# Patient Record
Sex: Female | Born: 1965 | Race: Black or African American | Hispanic: No | Marital: Married | Smoking: Former smoker
Health system: Southern US, Community
[De-identification: ages and names within clinical notes are randomized; demographics above are authoritative.]

## PROBLEM LIST (undated history)

## (undated) DIAGNOSIS — E559 Vitamin D deficiency, unspecified: Secondary | ICD-10-CM

## (undated) DIAGNOSIS — E785 Hyperlipidemia, unspecified: Secondary | ICD-10-CM

## (undated) HISTORY — PX: LAPAROSCOPIC CHOLECYSTECTOMY: SUR755

## (undated) HISTORY — DX: Hyperlipidemia, unspecified: E78.5

## (undated) HISTORY — DX: Morbid (severe) obesity due to excess calories: E66.01

## (undated) HISTORY — DX: Vitamin D deficiency, unspecified: E55.9

---

## 1991-08-15 HISTORY — PX: BREAST SURGERY: SHX581

## 2000-06-01 ENCOUNTER — Other Ambulatory Visit: Admission: RE | Admit: 2000-06-01 | Discharge: 2000-06-01 | Payer: Self-pay | Admitting: Internal Medicine

## 2001-04-07 ENCOUNTER — Emergency Department (HOSPITAL_COMMUNITY): Admission: EM | Admit: 2001-04-07 | Discharge: 2001-04-07 | Payer: Self-pay | Admitting: Emergency Medicine

## 2001-08-14 HISTORY — PX: TUBAL LIGATION: SHX77

## 2002-03-20 ENCOUNTER — Emergency Department (HOSPITAL_COMMUNITY): Admission: EM | Admit: 2002-03-20 | Discharge: 2002-03-20 | Payer: Self-pay | Admitting: Emergency Medicine

## 2002-03-20 ENCOUNTER — Encounter: Payer: Self-pay | Admitting: Emergency Medicine

## 2004-06-01 ENCOUNTER — Other Ambulatory Visit: Admission: RE | Admit: 2004-06-01 | Discharge: 2004-06-01 | Payer: Self-pay | Admitting: Emergency Medicine

## 2005-08-25 ENCOUNTER — Encounter: Admission: RE | Admit: 2005-08-25 | Discharge: 2005-08-25 | Payer: Self-pay | Admitting: Emergency Medicine

## 2005-10-16 ENCOUNTER — Other Ambulatory Visit: Admission: RE | Admit: 2005-10-16 | Discharge: 2005-10-16 | Payer: Self-pay | Admitting: Family Medicine

## 2006-08-14 HISTORY — PX: FOOT SURGERY: SHX648

## 2006-11-13 ENCOUNTER — Emergency Department (HOSPITAL_COMMUNITY): Admission: EM | Admit: 2006-11-13 | Discharge: 2006-11-14 | Payer: Self-pay | Admitting: Emergency Medicine

## 2007-09-11 ENCOUNTER — Other Ambulatory Visit: Admission: RE | Admit: 2007-09-11 | Discharge: 2007-09-11 | Payer: Self-pay | Admitting: Emergency Medicine

## 2007-09-18 DIAGNOSIS — A5901 Trichomonal vulvovaginitis: Secondary | ICD-10-CM

## 2007-10-07 ENCOUNTER — Ambulatory Visit (HOSPITAL_COMMUNITY): Admission: RE | Admit: 2007-10-07 | Discharge: 2007-10-07 | Payer: Self-pay | Admitting: Emergency Medicine

## 2008-08-16 ENCOUNTER — Emergency Department (HOSPITAL_COMMUNITY): Admission: EM | Admit: 2008-08-16 | Discharge: 2008-08-16 | Payer: Self-pay | Admitting: Emergency Medicine

## 2009-01-05 DIAGNOSIS — D259 Leiomyoma of uterus, unspecified: Secondary | ICD-10-CM | POA: Insufficient documentation

## 2009-12-27 ENCOUNTER — Ambulatory Visit (HOSPITAL_COMMUNITY): Admission: RE | Admit: 2009-12-27 | Discharge: 2009-12-28 | Payer: Self-pay | Admitting: Otolaryngology

## 2009-12-27 ENCOUNTER — Encounter (INDEPENDENT_AMBULATORY_CARE_PROVIDER_SITE_OTHER): Payer: Self-pay | Admitting: Otolaryngology

## 2010-03-04 ENCOUNTER — Ambulatory Visit (HOSPITAL_BASED_OUTPATIENT_CLINIC_OR_DEPARTMENT_OTHER): Admission: RE | Admit: 2010-03-04 | Discharge: 2010-03-04 | Payer: Self-pay | Admitting: Specialist

## 2010-05-26 ENCOUNTER — Emergency Department (HOSPITAL_COMMUNITY): Admission: EM | Admit: 2010-05-26 | Discharge: 2010-05-26 | Payer: Self-pay | Admitting: Emergency Medicine

## 2010-07-20 ENCOUNTER — Ambulatory Visit: Payer: Self-pay | Admitting: Obstetrics & Gynecology

## 2010-07-21 ENCOUNTER — Emergency Department (HOSPITAL_COMMUNITY): Admission: EM | Admit: 2010-07-21 | Discharge: 2010-06-27 | Payer: Self-pay | Admitting: Emergency Medicine

## 2010-08-02 ENCOUNTER — Ambulatory Visit: Payer: Self-pay | Admitting: Internal Medicine

## 2010-08-02 DIAGNOSIS — E7800 Pure hypercholesterolemia, unspecified: Secondary | ICD-10-CM | POA: Insufficient documentation

## 2010-08-02 DIAGNOSIS — N920 Excessive and frequent menstruation with regular cycle: Secondary | ICD-10-CM | POA: Insufficient documentation

## 2010-08-02 DIAGNOSIS — R079 Chest pain, unspecified: Secondary | ICD-10-CM

## 2010-08-03 ENCOUNTER — Encounter (INDEPENDENT_AMBULATORY_CARE_PROVIDER_SITE_OTHER): Payer: Self-pay | Admitting: Internal Medicine

## 2010-08-04 ENCOUNTER — Ambulatory Visit (HOSPITAL_COMMUNITY)
Admission: RE | Admit: 2010-08-04 | Discharge: 2010-08-04 | Payer: Self-pay | Source: Home / Self Care | Attending: Family Medicine | Admitting: Family Medicine

## 2010-08-11 ENCOUNTER — Telehealth (INDEPENDENT_AMBULATORY_CARE_PROVIDER_SITE_OTHER): Payer: Self-pay | Admitting: Internal Medicine

## 2010-08-14 HISTORY — PX: KNEE SURGERY: SHX244

## 2010-08-16 ENCOUNTER — Encounter (INDEPENDENT_AMBULATORY_CARE_PROVIDER_SITE_OTHER): Payer: Self-pay | Admitting: Internal Medicine

## 2010-08-16 ENCOUNTER — Telehealth (INDEPENDENT_AMBULATORY_CARE_PROVIDER_SITE_OTHER): Payer: Self-pay | Admitting: Internal Medicine

## 2010-09-01 ENCOUNTER — Encounter: Payer: Self-pay | Admitting: Family

## 2010-09-01 ENCOUNTER — Ambulatory Visit
Admission: RE | Admit: 2010-09-01 | Discharge: 2010-09-01 | Payer: Self-pay | Source: Home / Self Care | Attending: Obstetrics and Gynecology | Admitting: Obstetrics and Gynecology

## 2010-09-01 ENCOUNTER — Other Ambulatory Visit: Payer: Self-pay | Admitting: Family

## 2010-09-01 ENCOUNTER — Other Ambulatory Visit
Admission: RE | Admit: 2010-09-01 | Discharge: 2010-09-01 | Payer: Self-pay | Source: Home / Self Care | Admitting: Obstetrics and Gynecology

## 2010-09-01 LAB — CONVERTED CEMR LAB
Hemoglobin: 10.6 g/dL — ABNORMAL LOW (ref 12.0–15.0)
MCHC: 31.5 g/dL (ref 30.0–36.0)
MCV: 85.1 fL (ref 78.0–100.0)
Platelets: 431 10*3/uL — ABNORMAL HIGH (ref 150–400)
RBC: 3.96 M/uL (ref 3.87–5.11)
TSH: 1.913 microintl units/mL (ref 0.350–4.500)

## 2010-09-04 ENCOUNTER — Encounter: Payer: Self-pay | Admitting: Emergency Medicine

## 2010-09-05 LAB — POCT PREGNANCY, URINE: Preg Test, Ur: NEGATIVE

## 2010-09-12 ENCOUNTER — Ambulatory Visit (HOSPITAL_COMMUNITY)
Admission: RE | Admit: 2010-09-12 | Discharge: 2010-09-12 | Payer: Self-pay | Source: Home / Self Care | Attending: Obstetrics & Gynecology | Admitting: Obstetrics & Gynecology

## 2010-09-15 ENCOUNTER — Ambulatory Visit: Admit: 2010-09-15 | Payer: Self-pay | Admitting: Obstetrics and Gynecology

## 2010-09-15 ENCOUNTER — Ambulatory Visit: Payer: Self-pay | Admitting: Physician Assistant

## 2010-09-15 DIAGNOSIS — N949 Unspecified condition associated with female genital organs and menstrual cycle: Secondary | ICD-10-CM

## 2010-09-15 DIAGNOSIS — N938 Other specified abnormal uterine and vaginal bleeding: Secondary | ICD-10-CM

## 2010-09-15 NOTE — Letter (Signed)
Summary: REQUESTING RECORDS FROM GYN/COULD NOT LOCATE RECORDS  REQUESTING RECORDS FROM GYN/COULD NOT LOCATE RECORDS   Imported By: Arta Bruce 08/09/2010 11:51:29  _____________________________________________________________________  External Attachment:    Type:   Image     Comment:   External Document

## 2010-09-15 NOTE — Letter (Signed)
Summary: REQUESTING RECORDS FROM [facility]  REQUESTING RECORDS FROM [facility]   Imported By: Arta Bruce 08/09/2010 14:02:15  _____________________________________________________________________  External Attachment:    Type:   Image     Comment:   External Document

## 2010-09-15 NOTE — Progress Notes (Signed)
Summary: Still having menstrual bleeding  Phone Note Call from Patient   Summary of Call: PT WAS SEEN ON THE 20TH OF DEC/ DR Athira Janowicz TOLD HER TO CALL IF HER BLEEDING DID NOT STOP IT HAS LIGHTEN ALOT BUT NOT COMP[LETLEY STOPPED [phone] Initial call taken by: Arta Bruce,  August 11, 2010 4:43 PM  Follow-up for Phone Call        Is going through 3 pads per day, has progressively gotten lighter, darker red, but not completely stopped.  Is having abdominal cramping.  Is not taking anything for it.  Advised that she can take ibuprofen OTC for the cramping. Follow-up by: Dutch Quint RN,  August 11, 2010 5:23 PM  Additional Follow-up for Phone Call Additional follow up Details #1::        Let her know we are going to refer her back to gyn.  Also, let's try Medroxyprogesterone 20 mg daily for 7 days--will send to [facility] [address] unless she request elsewhere. Additional Follow-up by: Julieanne Manson MD,  August 16, 2010 8:58 AM    Additional Follow-up for Phone Call Additional follow up Details #2::    Left message on answering machine for pt. to return call.  Dutch Quint RN  August 17, 2010 12:54 PM  Pt. states that bleeding is heavier than ever -- is now having cramping and has gone through [address] pads since Sunday.  Is also passing a lot of clots.  Should she still get the medication or do something else?  Theresa Laib RN  August 18, 2010 9:38 AM  Yes, she should still take the medication as instructed above by Dr. Anayely Constantine n.martin,fnp August 18, 2010 7:57 PM  Pt. made aware of gyn referral and to take new Rx - verbalized understanding and agreement.  Theresa Laib RN  August 19, 2010 11:54 AM   New/Updated Medications: MEDROXYPROGESTERONE ACETATE 10 MG TABS (MEDROXYPROGESTERONE ACETATE) 2 tabs by mouth daily for 7 days Prescriptions: MEDROXYPROGESTERONE ACETATE 10 MG TABS (MEDROXYPROGESTERONE ACETATE) 2 tabs by mouth daily for 7 days  #14 x 0   Entered and  Authorized by:   Denver Harder MD   Signed by:   Gerlean Cid MD on 08/16/2010   Method used:   Electronically to        [facility] [address] [id number] (retail)       [address]       [place], [place]  [address]       Ph: [phone]       Fax: [phone]   RxID:   [id number]

## 2010-09-15 NOTE — Progress Notes (Signed)
Summary: GYN REFERRAL   Phone Note Outgoing Call   Summary of Call: [name]--Gyne referral Initial call taken by: Julieanne Manson MD,  August 16, 2010 9:01 AM  Follow-up for Phone Call        SEND THE REFERRAL TO [facility] NUTRITION WAITING FOR AN APPT  Follow-up by: Cheryll Dessert,  August 18, 2010 6:48 PM

## 2010-09-15 NOTE — Letter (Signed)
Summary: *Referral Letter  [facility]  [address] [place], [place] [address]   Phone: [phone]  Fax: [phone]    08/16/2010  Thank you in advance for agreeing to see my patient:  [name] [address] [place], [place]  [address]  Phone: [phone]  Reason for Referral: Menometrorrhagia in a new patient.  Pt. gives a history of work up last year.  Reportedly with normal pelvic ultrasound.  Bleeding has slowed, but not stopped with 10 days of 10 mg Medroxyprogesterone.  I have increased dose to 20 mg for a week.  She gives history of being encouraged to consider endometrial ablation following work up last year.  Have sent for her records.  Procedures Requested: Evaluation and recommendations for excessive and prolonged bleeding.  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) 2)  OBESITY (ICD-278.00) 3)  HYPERCHOLESTEROLEMIA (ICD-272.0) 4)  CHEST PAIN (ICD-786.50)   Current Medications: 1)  VITAMIN D 1000 UNIT CAPS (CHOLECALCIFEROL) Take 50,000 International Units once weekly 2)  FEOSOL 200 (65 FE) MG TABS (FERROUS SULFATE DRIED) Take one tablet by mouth daily 3)  HYDROCODONE-ACETAMINOPHEN 5-325 MG TABS (HYDROCODONE-ACETAMINOPHEN) Take one tablet by mouth as needed for knee pain 4)  MEDROXYPROGESTERONE ACETATE 10 MG TABS (MEDROXYPROGESTERONE ACETATE) 2 tabs by mouth daily for 7 days   Past Medical History:  Prior History of Blood Transfusions:   Pertinent Labs:    Thank you again for agreeing to see our patient; please contact us if you have any further questions or need additional information.  Sincerely,  Julieanne Manson MD

## 2010-09-15 NOTE — Assessment & Plan Note (Signed)
Summary: np.. coughing   Vital Signs:  Patient profile:   45 year old female Menstrual status:  irregular LMP:     09/11 Height:      64.5 inches Weight:      276.9 pounds BMI:     46.96 Temp:     97.7 degrees F oral Pulse rate:   80 / minute Pulse rhythm:   regular Resp:     20 per minute BP sitting:   110 / 70  (left arm) Cuff size:   large  Vitals Entered By: Gaylyn Cheers RN (August 02, 2010 12:04 PM) CC: vag bleeding x's 3 mos. with numerous clots goes through a pkg of 32 wkly. prior to that irregular. No pap in 2 yrs. Seen @ [facility] for [facility] last year and again 11/11. Is Patient Diabetic? No Pain Assessment Patient in pain? no       Does patient need assistance? Functional Status Self care Ambulation Normal LMP (date): 09/11     Menstrual Status irregular Enter LMP: 09/11   CC:  vag bleeding x's 3 mos. with numerous clots goes through a pkg of 32 wkly. prior to that irregular. No pap in 2 yrs. Seen @  for CP last year and again 11/11.Marland Kitchen  History of Present Illness: 1.  Vaginal bleeding:  Has always had problems with cycle.  Menarche age 70.  Initially, skipped months.  Eventually, developed problems where bleeds all the time.  Was seen in ED last month for chest pain of unknown etiology and hgb was 11.7. Was on BCPs in years past with control of periods.   Last 3 months, in particular, has been bleeding without stop.  Now getting heavier with more blood clotting.  Using maxi pads.  Using 32 pads in 7 days.  Not much in way of cramping.  G2P2.  Delivered at term.  Youngest son is 70.  Did not really have much of change after pregnancies.  Was seen by a gynecologist in 2010, who recommended endometrial ablation.  Did undergo pelvic ultrasound that was reportedly fine.    Current Medications (verified): 1)  Vitamin D 1000 Unit Caps (Cholecalciferol) .... Take 50,000 International Units Once Weekly 2)  Feosol 200 (65 Fe) Mg Tabs (Ferrous Sulfate Dried) .... Take  One Tablet By Mouth Daily 3)  Hydrocodone-Acetaminophen 5-325 Mg Tabs (Hydrocodone-Acetaminophen) .... Take One Tablet By Mouth As Needed For Knee Pain  Allergies (verified): No Known Drug Allergies  Past History:  Past Surgical History: 1. 2009:   Right foot surgery   2.  2011:  Left arthroscopic surgery:  Dr. Blossom Hoops debridement. 3.  2005:  Laparoscopic cholecystectomy 4.  1992:  BTL  Family History: Mother, died 33:  Uterine cancer and PE. Father, died 28:  DM, not sure of cause of death. 8 siblings:  1 sister, died 46:  rare lung disease. Son, 25:  Healthy Son, 19:  Healthy  Social History: Married--but informal Metallurgist. Lives at home with 2 sons.   Tobacco Use:  no Alcohol Use: no Drugh Use:  no  Physical Exam  General:  morbidly obese, NAD Lungs:  Normal respiratory effort, chest expands symmetrically. Lungs are clear to auscultation, no crackles or wheezes. Heart:  Normal rate and regular rhythm. S1 and S2 normal without gallop, murmur, click, rub or other extra sounds. Abdomen:  Bowel sounds positive,abdomen soft and non-tender without masses, organomegaly or hernias noted.   Impression & Recommendations:  Problem # 1:  EXCESSIVE OR  FREQUENT MENSTRUATION (ICD-626.2) Need records. Her updated medication list for this problem includes:    Medroxyprogesterone Acetate 10 Mg Tabs (Medroxyprogesterone acetate) .Marland Kitchen... 1 tab by mouth daily for 10 days  Complete Medication List: 1)  Vitamin D 1000 Unit Caps (Cholecalciferol) .... Take 50,000 international units once weekly 2)  Feosol 200 (65 Fe) Mg Tabs (Ferrous sulfate dried) .... Take one tablet by mouth daily 3)  Hydrocodone-acetaminophen 5-325 Mg Tabs (Hydrocodone-acetaminophen) .... Take one tablet by mouth as needed for knee pain 4)  Medroxyprogesterone Acetate 10 Mg Tabs (Medroxyprogesterone acetate) .Marland Kitchen.. 1 tab by mouth daily for 10 days  Other Orders: Flu Vaccine 59yrs + (96295) Admin 1st  Vaccine (28413)  Patient Instructions: 1)  Release of information from [facility] and Dr. Cheri Guppy at [facility] records. 2)  Schedule CPP next available with Dr. Delrae Alfred 3)  Call update next Tuesday if still bleeding. Prescriptions: MEDROXYPROGESTERONE ACETATE 10 MG TABS (MEDROXYPROGESTERONE ACETATE) 1 tab by mouth daily for 10 days  #10 x 0   Entered and Authorized by:   Julieanne Manson MD   Signed by:   Julieanne Manson MD on 08/02/2010   Method used:   Print then Give to Patient   RxID:   [id number]    Orders Added: 1)  Flu Vaccine 28yrs + [34742] 2)  Admin 1st Vaccine [90471] 3)  New Patient Level II [59563]   Immunizations Administered:  Influenza Vaccine # 1:    Vaccine Type: Fluvax 3+    Site: left deltoid    Mfr: GlaxoSmithKline    Dose: 0.5 ml    Route: IM    Given by: Gaylyn Cheers RN    Exp. Date: 02/11/2011    Lot #: [id number]    VIS given: 03/08/10 version given August 02, 2010.  Flu Vaccine Consent Questions:    Do you have a history of severe allergic reactions to this vaccine? no    Any prior history of allergic reactions to egg and/or gelatin? no    Do you have a sensitivity to the preservative Thimersol? no    Do you have a past history of Guillan-Barre Syndrome? no    Do you currently have an acute febrile illness? no    Have you ever had a severe reaction to latex? no    Vaccine information given and explained to patient? yes    Are you currently pregnant? no   Immunizations Administered:  Influenza Vaccine # 1:    Vaccine Type: Fluvax 3+    Site: left deltoid    Mfr: GlaxoSmithKline    Dose: 0.5 ml    Route: IM    Given by: Gaylyn Cheers RN    Exp. Date: 02/11/2011    Lot #: [id number]    VIS given: 03/08/10 version given August 02, 2010.

## 2010-10-09 ENCOUNTER — Encounter (INDEPENDENT_AMBULATORY_CARE_PROVIDER_SITE_OTHER): Payer: Self-pay | Admitting: Internal Medicine

## 2010-10-09 DIAGNOSIS — M2240 Chondromalacia patellae, unspecified knee: Secondary | ICD-10-CM | POA: Insufficient documentation

## 2010-10-09 DIAGNOSIS — M1710 Unilateral primary osteoarthritis, unspecified knee: Secondary | ICD-10-CM | POA: Insufficient documentation

## 2010-10-09 DIAGNOSIS — Z862 Personal history of diseases of the blood and blood-forming organs and certain disorders involving the immune mechanism: Secondary | ICD-10-CM | POA: Insufficient documentation

## 2010-10-20 NOTE — Miscellaneous (Signed)
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 2009:   Right foot surgery   2.  2011:  Left arthroscopic surgery:  Dr. Blossom Hoops debridement. 3.  2005:  Laparoscopic cholecystectomy 4.  1992:  BTL 5.  12/2008: Endometrial biopsy  Dr. Burr Medico (10/09/2010 14:12)        Past History:  Past Surgical History: 1. 2009:   Right foot surgery   2.  2011:  Left arthroscopic surgery:  Dr. Blossom Hoops debridement. 3.  2005:  Laparoscopic cholecystectomy 4.  1992:  BTL 5.  12/2008: Endometrial biopsy  Dr. Burr Medico

## 2010-10-20 NOTE — Letter (Signed)
Summary: RECEIVED RECORDS FROM [facility]  RECEIVED RECORDS FROM [facility]   Imported By: Arta Bruce 10/11/2010 16:12:49  _____________________________________________________________________  External Attachment:    Type:   Image     Comment:   External Document

## 2010-10-25 LAB — BASIC METABOLIC PANEL
CO2: 28 mEq/L (ref 19–32)
Chloride: 103 mEq/L (ref 96–112)
Creatinine, Ser: 0.93 mg/dL (ref 0.4–1.2)
GFR calc Af Amer: >60 mL/min (ref >60)
Glucose, Bld: 138 mg/dL — ABNORMAL HIGH (ref 70–99)
Sodium: 137 mEq/L (ref 135–145)

## 2010-10-25 LAB — CBC
Hemoglobin: 11.7 g/dL — ABNORMAL LOW (ref 12.0–15.0)
MCH: 26.5 pg (ref 26.0–34.0)
MCHC: 31.5 g/dL (ref 30.0–36.0)
MCV: 84.2 fL (ref 78.0–100.0)
RBC: 4.42 MIL/uL (ref 3.87–5.11)

## 2010-10-25 LAB — DIFFERENTIAL
Basophils Relative: 0 % (ref 0–1)
Eosinophils Absolute: 0.2 10*3/uL (ref 0.0–0.7)
Eosinophils Relative: 2 % (ref 0–5)
Lymphs Abs: 2.7 10*3/uL (ref 0.7–4.0)
Monocytes Relative: 8 % (ref 3–12)

## 2010-10-25 LAB — POCT CARDIAC MARKERS
CKMB, poc: <1 ng/mL — ABNORMAL LOW (ref 1.0–8.0)
Myoglobin, poc: 59.8 ng/mL (ref 12–200)
Troponin i, poc: <0.05 ng/mL (ref 0.00–0.09)
Troponin i, poc: <0.05 ng/mL (ref 0.00–0.09)

## 2010-10-29 LAB — POCT HEMOGLOBIN-HEMACUE: Hemoglobin: 12.5 g/dL (ref 12.0–15.0)

## 2010-11-01 LAB — BASIC METABOLIC PANEL
BUN: 7 mg/dL (ref 6–23)
Calcium: 9.3 mg/dL (ref 8.4–10.5)
Creatinine, Ser: 0.86 mg/dL (ref 0.4–1.2)
GFR calc non Af Amer: >60 mL/min (ref >60)
Glucose, Bld: 91 mg/dL (ref 70–99)

## 2010-11-01 LAB — CBC
HCT: 36.3 % (ref 36.0–46.0)
Platelets: 316 10*3/uL (ref 150–400)
RDW: 14.9 % (ref 11.5–15.5)

## 2010-11-03 NOTE — Progress Notes (Signed)
[name], [name]             ACCOUNT NO.:  [id number]  MEDICAL RECORD NO.:  [id number]           PATIENT TYPE:  LOCATION:  [facility]                     FACILITY:  PHYSICIAN:  Sid Falcon, CNM  DATE OF BIRTH:  [birth year]  DATE OF SERVICE:  09/01/2010                                 CLINIC NOTE  LOCATION:  [facility].  REASON FOR VISIT:  Dysfunctional uterine bleeding.  The patient is here with reports of abnormal bleeding since October 2011, was utilizing approximately 5 pads per day, and passing clots on a daily basis.  The patient was seen at her previous family practice office in which she was prescribed Provera.  She began with 10 mg per day and was increased to 20 mg and the bleeding has not had decreased.  HEALTH CARE MAINTENANCE:  Last mammogram December 2011, normal.  Last dental exam 2011, normal.  MENSTRUAL HISTORY:  Menarche 45 years of age.  Reports irregular cycles x2 years with the first being every 4 to 6 months until this incident of bleeding that started in October.  OBSTETRICAL HISTORY:  History of 2 pregnancies and 2 living children.  GYNECOLOGIC HISTORY:  Last Pap smear 2 years ago.  The patient denies abnormal Pap smear in the past.  STD HISTORY:  Denies.  PAST MEDICAL HISTORY:  Pneumonia.  PAST SURGICAL HISTORY:  Foot and knee surgery, breast reduction, tubal ligation, and gallstones.  SOCIAL HISTORY:  The patient denies smoking, alcohol, or illicit drug use; has had only one partner in one past year.  FAMILY HISTORY:  Father diabetes and mother cancer, type not listed.  REVIEW OF SYSTEMS:  The patient reports continue with lower pelvic pain with the bleeding.  No other symptoms identified.  PHYSICAL EXAMINATION:  VITAL SIGNS:  Stable.  Blood pressure 115/73, pulse 87, temperature 97.9, weight 273 pounds. GENERAL:  Alert and oriented x3.  No signs of acute distress. NECK:  No thyromegaly.  No dominant mass, nontender with  palpation. CHEST:  Cardiovascular system regular rate and rhythm without murmurs, gallops, rubs. LUNGS:  Clear to auscultation. ABDOMEN:  Soft, nontender, no dominant masses palpated. PELVIC:  No abnormal lesions.  No abnormal discharge.  Cervix visualized without difficulty.  Uterus was sounded to approximately 7 cm, endometrial biopsy obtained without difficulty.  Pap smear also obtained prior to the biopsy, slight bleeding with the procedure.  The patient tolerated the procedure well.  Her adnexa and uterus difficult to palpate secondary to weight.  ASSESSMENT:  Dysfunctional uterine bleeding.  PLAN:  Labs:  CBC, TSH, FSH, LH, Pap smear, endometrial biopsy, pelvic ultrasound scheduled.  A prescription given for Megace 60 mg b.i.d. x3 days, then to go to 40 mg daily x10 days.  Consulted with Dr. Macon Large and agreed with plan.  The patient will follow up in 2 weeks.     Sid Falcon, CNM    WM/MEDQ  D:  09/01/2010  T:  09/02/2010  Job:  [id number]

## 2010-11-28 LAB — RAPID STREP SCREEN (MED CTR MEBANE ONLY): Streptococcus, Group A Screen (Direct): NEGATIVE

## 2010-12-02 ENCOUNTER — Ambulatory Visit (INDEPENDENT_AMBULATORY_CARE_PROVIDER_SITE_OTHER): Payer: Self-pay

## 2010-12-02 DIAGNOSIS — N949 Unspecified condition associated with female genital organs and menstrual cycle: Secondary | ICD-10-CM

## 2011-02-10 DIAGNOSIS — N938 Other specified abnormal uterine and vaginal bleeding: Secondary | ICD-10-CM | POA: Insufficient documentation

## 2011-02-20 ENCOUNTER — Ambulatory Visit (INDEPENDENT_AMBULATORY_CARE_PROVIDER_SITE_OTHER): Payer: Self-pay | Admitting: *Deleted

## 2011-02-20 VITALS — BP 118/82 | HR 73

## 2011-02-20 DIAGNOSIS — Z3049 Encounter for surveillance of other contraceptives: Secondary | ICD-10-CM

## 2011-02-20 DIAGNOSIS — N938 Other specified abnormal uterine and vaginal bleeding: Secondary | ICD-10-CM

## 2011-02-20 DIAGNOSIS — N949 Unspecified condition associated with female genital organs and menstrual cycle: Secondary | ICD-10-CM

## 2011-02-21 MED ORDER — MEDROXYPROGESTERONE ACETATE 150 MG/ML IM SUSP
150.0000 mg | INTRAMUSCULAR | Status: AC
  Administered 2011-05-09 – 2011-07-25 (×2): 150 mg via INTRAMUSCULAR

## 2011-02-21 MED ORDER — MEDROXYPROGESTERONE ACETATE 150 MG/ML IM SUSP
150.0000 mg | Freq: Once | INTRAMUSCULAR | Status: AC
  Administered 2011-02-20: 150 mg via INTRAMUSCULAR

## 2011-05-09 ENCOUNTER — Ambulatory Visit (INDEPENDENT_AMBULATORY_CARE_PROVIDER_SITE_OTHER): Payer: Self-pay | Admitting: *Deleted

## 2011-05-09 VITALS — BP 111/77 | HR 75

## 2011-05-09 DIAGNOSIS — N949 Unspecified condition associated with female genital organs and menstrual cycle: Secondary | ICD-10-CM

## 2011-05-09 DIAGNOSIS — Z3049 Encounter for surveillance of other contraceptives: Secondary | ICD-10-CM

## 2011-07-25 ENCOUNTER — Other Ambulatory Visit: Payer: Self-pay | Admitting: Obstetrics & Gynecology

## 2011-07-25 ENCOUNTER — Ambulatory Visit (INDEPENDENT_AMBULATORY_CARE_PROVIDER_SITE_OTHER): Payer: Self-pay

## 2011-07-25 ENCOUNTER — Other Ambulatory Visit: Payer: Self-pay | Admitting: Obstetrics and Gynecology

## 2011-07-25 ENCOUNTER — Other Ambulatory Visit: Payer: Self-pay | Admitting: Family Medicine

## 2011-07-25 VITALS — BP 115/80 | HR 76

## 2011-07-25 DIAGNOSIS — Z1231 Encounter for screening mammogram for malignant neoplasm of breast: Secondary | ICD-10-CM

## 2011-07-25 DIAGNOSIS — N920 Excessive and frequent menstruation with regular cycle: Secondary | ICD-10-CM

## 2011-07-25 DIAGNOSIS — N949 Unspecified condition associated with female genital organs and menstrual cycle: Secondary | ICD-10-CM

## 2011-07-28 ENCOUNTER — Ambulatory Visit (HOSPITAL_COMMUNITY)
Admission: RE | Admit: 2011-07-28 | Discharge: 2011-07-28 | Disposition: A | Discharge status: Home / Self Care | Payer: Self-pay | Source: Ambulatory Visit | Attending: Obstetrics & Gynecology | Admitting: Obstetrics & Gynecology

## 2011-07-28 DIAGNOSIS — Z1231 Encounter for screening mammogram for malignant neoplasm of breast: Secondary | ICD-10-CM | POA: Insufficient documentation

## 2011-08-21 ENCOUNTER — Ambulatory Visit: Payer: Self-pay | Admitting: Obstetrics and Gynecology

## 2011-10-10 ENCOUNTER — Ambulatory Visit: Payer: Self-pay

## 2011-12-15 ENCOUNTER — Other Ambulatory Visit (HOSPITAL_COMMUNITY)
Admission: RE | Admit: 2011-12-15 | Discharge: 2011-12-15 | Disposition: A | Discharge status: Home / Self Care | Payer: BC Managed Care – PPO | Source: Ambulatory Visit | Attending: Family Medicine | Admitting: Family Medicine

## 2011-12-15 ENCOUNTER — Other Ambulatory Visit: Payer: Self-pay | Admitting: Family Medicine

## 2011-12-15 DIAGNOSIS — Z1159 Encounter for screening for other viral diseases: Secondary | ICD-10-CM | POA: Insufficient documentation

## 2011-12-15 DIAGNOSIS — Z124 Encounter for screening for malignant neoplasm of cervix: Secondary | ICD-10-CM | POA: Insufficient documentation

## 2012-08-26 ENCOUNTER — Other Ambulatory Visit (HOSPITAL_COMMUNITY): Payer: Self-pay | Admitting: Family Medicine

## 2012-08-26 DIAGNOSIS — Z1231 Encounter for screening mammogram for malignant neoplasm of breast: Secondary | ICD-10-CM

## 2012-09-06 ENCOUNTER — Ambulatory Visit (HOSPITAL_COMMUNITY): Payer: BC Managed Care – PPO

## 2012-09-13 ENCOUNTER — Ambulatory Visit (HOSPITAL_COMMUNITY): Payer: BC Managed Care – PPO

## 2012-10-02 ENCOUNTER — Other Ambulatory Visit (HOSPITAL_COMMUNITY): Payer: Self-pay | Admitting: Family Medicine

## 2012-10-02 DIAGNOSIS — Z1231 Encounter for screening mammogram for malignant neoplasm of breast: Secondary | ICD-10-CM

## 2012-10-09 ENCOUNTER — Ambulatory Visit (INDEPENDENT_AMBULATORY_CARE_PROVIDER_SITE_OTHER): Payer: BC Managed Care – PPO | Admitting: General Surgery

## 2012-10-11 ENCOUNTER — Inpatient Hospital Stay (HOSPITAL_COMMUNITY): Admission: RE | Admit: 2012-10-11 | Payer: BC Managed Care – PPO | Source: Ambulatory Visit

## 2012-10-25 ENCOUNTER — Ambulatory Visit (INDEPENDENT_AMBULATORY_CARE_PROVIDER_SITE_OTHER): Payer: BC Managed Care – PPO | Admitting: General Surgery

## 2012-10-25 ENCOUNTER — Encounter (INDEPENDENT_AMBULATORY_CARE_PROVIDER_SITE_OTHER): Payer: Self-pay | Admitting: General Surgery

## 2012-10-25 VITALS — BP 134/76 | HR 84 | Temp 98.6°F | Resp 18 | Ht 67.0 in | Wt 266.0 lb

## 2012-10-25 NOTE — Patient Instructions (Signed)
We will start the work-up

## 2012-10-25 NOTE — Progress Notes (Signed)
Patient ID: [name], female   DOB: 09-19-1965, 47 y.o.   MRN: [id number]  Chief Complaint  Patient presents with  . New Evaluation    Lap band    HPI [name] is a 47 y.o. female.   HPI  47 yo AAF referred by Dr Mila Palmer for evaluation of weight loss surgery. The patient states that she is specifically interested in laparoscopic adjustable gastric band placement. She likes the fact that it is reversible.she has also had several acquaintances who have had the procedure.  She states that she has struggled her adult life with her weight. Despite numerous attempts for sustained weight loss, she has been unsuccessful. She has tried low-carb diet, liquid protein diets, Slim fast, low-calorie diet all without any long-term success.  She takes medications for cholesterol as well as for vitamin D deficiency. She is very concerned about her body weight and is interested in obtaining a tool to help her lose weight. Past Medical History  Diagnosis Date  . Hyperlipidemia   . Vitamin D deficiency disease     Past Surgical History  Procedure Laterality Date  . Breast surgery  1993    reduction  . Tubal ligation  2003  . Foot surgery  2008     rt foot tarpals   . Knee surgery  2012    rt knee   . Laparoscopic cholecystectomy      Family History  Problem Relation Age of Onset  . Cancer Mother     ovarian ca  . Diabetes Father     Social History History  Substance Use Topics  . Smoking status: Current Every Day Smoker  . Smokeless tobacco: Not on file     Comment: 1 pack qd last two years  . Alcohol Use: Not on file    No Known Allergies  Current Outpatient Prescriptions  Medication Sig Dispense Refill  . pravastatin (PRAVACHOL) 40 MG tablet       . Vitamin D, Ergocalciferol, (DRISDOL) 50000 UNITS CAPS       . megestrol (MEGACE) 40 MG tablet Take 40 mg by mouth daily.         No current facility-administered medications for this visit.    Review of  Systems Review of Systems  Constitutional: Negative for fever, chills and unexpected weight change.  HENT: Negative for hearing loss, congestion, sore throat, trouble swallowing and voice change.   Eyes: Negative for visual disturbance.  Respiratory: Negative for apnea, cough, shortness of breath and wheezing.   Cardiovascular: Negative for chest pain, palpitations and leg swelling.       +DOE; no orthopnea, PND  Gastrointestinal: Negative for nausea, vomiting, abdominal pain, diarrhea, constipation, blood in stool, abdominal distention and anal bleeding.       S/p lap chole. occasional reflux - food dependent  Genitourinary: Negative for dysuria, hematuria, vaginal bleeding and difficulty urinating.  Musculoskeletal: Negative for arthralgias.       B/l knee pain; has had R knee surgery; has degenerative disc dis of both knees per op note  Skin: Negative for rash and wound.  Neurological: Negative for dizziness, seizures, syncope and headaches.       Denies TIA, amaurosis fugax  Hematological: Negative for adenopathy. Does not bruise/bleed easily.  Psychiatric/Behavioral: Negative for confusion and agitation.    Blood pressure 134/76, pulse 84, temperature 98.6 F (37 C), resp. rate 18, height 5\' 7"  (1.702 m), weight 266 lb (120.657 kg).  Physical Exam Physical Exam  Vitals reviewed.  Constitutional: She is oriented to person, place, and time. She appears well-developed and well-nourished. No distress.  Morbidly obese  HENT:  Head: Normocephalic and atraumatic.  Right Ear: External ear normal.  Left Ear: External ear normal.  Eyes: Conjunctivae are normal. No scleral icterus.  Neck: Normal range of motion. Neck supple. No tracheal deviation present. No thyromegaly present.  Cardiovascular: Normal rate, regular rhythm and normal heart sounds.   No murmur heard. Pulmonary/Chest: Effort normal and breath sounds normal. No stridor. No respiratory distress. She has no wheezes.    Abdominal: Soft. She exhibits no distension. There is no tenderness. There is no rebound and no guarding.    Musculoskeletal: Normal range of motion. She exhibits no edema and no tenderness.  Lymphadenopathy:    She has no cervical adenopathy.  Neurological: She is alert and oriented to person, place, and time. She exhibits normal muscle tone.  Skin: Skin is warm and dry. No rash noted. She is not diaphoretic. No erythema. No pallor.  Psychiatric: She has a normal mood and affect. Her behavior is normal. Judgment and thought content normal.    Data Reviewed Dr Paulino Rily last clinic note Dr Ermelinda Das op note  Assessment    Morbid obesity BMI 41.66 Hypercholesterolemia Degenerative Disc Disease - Knees Tobacco use Elevated blood pressure     Plan    The patient meets weight loss surgery criteria. I think the patient would be an acceptable candidate for Laparoscopic adjustable gastric band placement.  We discussed laparoscopic adjustable gastric banding. The patient was given Agricultural engineer. We discussed the risk and benefits of surgery including but not limited to bleeding, infection, injury to surrounding structures, blood clot formation such as deep venous thrombosis or pulmonary embolism, need to convert to an open procedure, band slippage, band erosion, failure to loose weight, port complications (leak or flippage), potential need for reoperative surgery, esophageal dilatation, worsening reflux, and vitamin deficiencies. We discussed the typical post operative recovery course. We discussed that their postoperative diet will be modified for several weeks. We specifically talked about the need to be on a liquid diet for one to 2 weeks after surgery. We also discussed the typical postoperative course with a laparoscopic adjustable gastric band and the need for frequent postoperative visits to assess the volume status of the band.  We discussed the typical expected weight loss with a  laparoscopic adjustable gastric band. I explained to the patient that they can expect to lose 40-60% of their excess body weight if they are compliant with their postoperative instructions. However I did explain that some patients loose less than 40% and some patients lose more than 60% of their excess body weight.  I explained that the likelihood of improvement in their obesity is good.  I explained to the patient that we will start our evaluation process which includes labs, Upper GI to evaluate stomach and swallowing anatomy, nutritionist consultation, psychiatrist consultation, EKG, CXR, mammogram.  Maren Wiesen Sella. Andrey Campanile, MD, FACS General, Bariatric, & Minimally Invasive Surgery [facility], [place]           [facility] M 10/25/2012, 5:42 PM

## 2012-11-08 ENCOUNTER — Ambulatory Visit (HOSPITAL_COMMUNITY)
Admission: RE | Admit: 2012-11-08 | Discharge: 2012-11-08 | Disposition: A | Discharge status: Home / Self Care | Payer: BC Managed Care – PPO | Source: Ambulatory Visit | Attending: General Surgery | Admitting: General Surgery

## 2012-11-08 ENCOUNTER — Ambulatory Visit (HOSPITAL_COMMUNITY)
Admission: RE | Admit: 2012-11-08 | Discharge: 2012-11-08 | Disposition: A | Discharge status: Home / Self Care | Payer: BC Managed Care – PPO | Source: Ambulatory Visit | Attending: Family Medicine | Admitting: Family Medicine

## 2012-11-08 ENCOUNTER — Other Ambulatory Visit: Payer: Self-pay

## 2012-11-08 ENCOUNTER — Other Ambulatory Visit (HOSPITAL_COMMUNITY): Payer: Self-pay | Admitting: Family Medicine

## 2012-11-08 DIAGNOSIS — Z1231 Encounter for screening mammogram for malignant neoplasm of breast: Secondary | ICD-10-CM | POA: Insufficient documentation

## 2012-11-08 DIAGNOSIS — E7800 Pure hypercholesterolemia, unspecified: Secondary | ICD-10-CM | POA: Insufficient documentation

## 2012-11-08 DIAGNOSIS — K219 Gastro-esophageal reflux disease without esophagitis: Secondary | ICD-10-CM | POA: Insufficient documentation

## 2012-11-08 DIAGNOSIS — R030 Elevated blood-pressure reading, without diagnosis of hypertension: Secondary | ICD-10-CM | POA: Insufficient documentation

## 2012-11-08 DIAGNOSIS — F17200 Nicotine dependence, unspecified, uncomplicated: Secondary | ICD-10-CM | POA: Insufficient documentation

## 2012-11-08 DIAGNOSIS — Z6841 Body Mass Index (BMI) 40.0 and over, adult: Secondary | ICD-10-CM | POA: Insufficient documentation

## 2012-11-08 DIAGNOSIS — IMO0002 Reserved for concepts with insufficient information to code with codable children: Secondary | ICD-10-CM | POA: Insufficient documentation

## 2012-11-08 LAB — LIPID PANEL
LDL Cholesterol: 136 mg/dL — ABNORMAL HIGH (ref 0–99)
Total CHOL/HDL Ratio: 4.7 Ratio
VLDL: 30 mg/dL (ref 0–40)

## 2012-11-08 LAB — CBC WITH DIFFERENTIAL/PLATELET
Basophils Relative: 1 % (ref 0–1)
Eosinophils Absolute: 0.1 10*3/uL (ref 0.0–0.7)
Lymphs Abs: 2.1 10*3/uL (ref 0.7–4.0)
MCH: 27.4 pg (ref 26.0–34.0)
Neutro Abs: 3.6 10*3/uL (ref 1.7–7.7)
Neutrophils Relative %: 57 % (ref 43–77)
Platelets: 347 10*3/uL (ref 150–400)
RBC: 4.67 MIL/uL (ref 3.87–5.11)
WBC: 6.2 10*3/uL (ref 4.0–10.5)

## 2012-11-08 LAB — COMPREHENSIVE METABOLIC PANEL
ALT: 12 U/L (ref 0–35)
AST: 10 U/L (ref 0–37)
Albumin: 4 g/dL (ref 3.5–5.2)
Alkaline Phosphatase: 49 U/L (ref 39–117)
BUN: 9 mg/dL (ref 6–23)
CO2: 21 mEq/L (ref 19–32)
Calcium: 9.3 mg/dL (ref 8.4–10.5)
Chloride: 105 mEq/L (ref 96–112)
Creat: 0.9 mg/dL (ref 0.50–1.10)
Glucose, Bld: 102 mg/dL — ABNORMAL HIGH (ref 70–99)
Potassium: 4 mEq/L (ref 3.5–5.3)
Sodium: 137 mEq/L (ref 135–145)
Total Bilirubin: 0.2 mg/dL — ABNORMAL LOW (ref 0.3–1.2)
Total Protein: 7.2 g/dL (ref 6.0–8.3)

## 2012-11-08 LAB — T4: T4, Total: 6.4 ug/dL (ref 5.0–12.5)

## 2012-11-08 LAB — TSH: TSH: 1.615 u[IU]/mL (ref 0.350–4.500)

## 2012-11-12 ENCOUNTER — Telehealth (INDEPENDENT_AMBULATORY_CARE_PROVIDER_SITE_OTHER): Payer: Self-pay | Admitting: General Surgery

## 2012-11-12 MED ORDER — AMBULATORY NON FORMULARY MEDICATION: 1.0000 | Freq: Two times a day (BID) | Status: DC

## 2012-11-12 NOTE — Telephone Encounter (Signed)
Patient made aware h pylori test is positive. Prev Pac #28 with no refills 1 po bid x 14 days called to [facility] per patient request. She will call with any questions.

## 2012-11-12 NOTE — Telephone Encounter (Signed)
Message copied by Liliana Cline on Tue Nov 12, 2012 11:11 AM ------      Message from: Andrey Campanile, ERIC M      Created: Tue Nov 12, 2012  9:03 AM       Needs prevpac for +h pylori ------

## 2012-11-18 ENCOUNTER — Telehealth (INDEPENDENT_AMBULATORY_CARE_PROVIDER_SITE_OTHER): Payer: Self-pay

## 2012-11-18 NOTE — Telephone Encounter (Signed)
Called and spoke with Hospital doctor at [facility]. Gave her the okay to break up RX.

## 2012-11-18 NOTE — Telephone Encounter (Signed)
Fine with me

## 2012-11-18 NOTE — Telephone Encounter (Signed)
[facility] calling about the pt's prev pack rx costing too much money at $100 but the pharmacy will break the Rx's down individually what the prev pack is made up of and it will only cost the pt $30. Pls advise if this ok with Dr Andrey Campanile.

## 2012-11-22 ENCOUNTER — Ambulatory Visit: Payer: BC Managed Care – PPO | Admitting: *Deleted

## 2012-11-29 ENCOUNTER — Encounter: Payer: Self-pay | Admitting: *Deleted

## 2012-11-29 ENCOUNTER — Encounter: Payer: BC Managed Care – PPO | Attending: General Surgery | Admitting: *Deleted

## 2012-11-29 VITALS — Ht 68.0 in | Wt 264.5 lb

## 2012-11-29 DIAGNOSIS — Z01818 Encounter for other preprocedural examination: Secondary | ICD-10-CM | POA: Insufficient documentation

## 2012-11-29 DIAGNOSIS — Z713 Dietary counseling and surveillance: Secondary | ICD-10-CM | POA: Insufficient documentation

## 2012-11-29 NOTE — Progress Notes (Signed)
  Pre-Op Assessment Visit:  Pre-Operative [facility] Surgery  Medical Nutrition Therapy:  Appt start time: 0800   End time:  0900.  Patient was seen on 11/29/2012 for Pre-Operative [facility] Nutrition Assessment. Assessment and letter of approval faxed to [facility] Bariatric Surgery Program coordinator on 11/29/2012.  Approval letter sent to [facility] and will be available in the chart under the media tab.  Handouts given during visit include:  Pre-Op Goals   Bariatric Surgery Protein Shakes  Bariatric Support Group Calendar  Samples given during visit include:   Shaker bottle  [employer] Protein [employer] Lot # [id number]; Exp: 06/15 - 1 pkt Lot # [id number]; Exp: 06/15 - 1 pkt  Premier Protein Shake: 1 ea Lot # [id number]; Exp: 08/26/13  Patient to call for Pre-Op and Post-Op Nutrition Education at [facility] when surgery is scheduled.

## 2012-11-29 NOTE — Patient Instructions (Addendum)
   Follow Pre-Op Nutrition Goals to prepare for Lapband Surgery.   Call the [facility] at [phone] once you have been given your surgery date to enrolled in the Pre-Op Nutrition Class. You will need to attend this nutrition class 3-4 weeks prior to your surgery. 

## 2012-12-23 ENCOUNTER — Other Ambulatory Visit (HOSPITAL_COMMUNITY)
Admission: RE | Admit: 2012-12-23 | Discharge: 2012-12-23 | Disposition: A | Discharge status: Home / Self Care | Payer: BC Managed Care – PPO | Source: Ambulatory Visit

## 2012-12-23 ENCOUNTER — Other Ambulatory Visit: Payer: Self-pay

## 2012-12-23 DIAGNOSIS — Z124 Encounter for screening for malignant neoplasm of cervix: Secondary | ICD-10-CM | POA: Insufficient documentation

## 2013-01-31 ENCOUNTER — Encounter (HOSPITAL_COMMUNITY): Admission: RE | Payer: Self-pay | Source: Ambulatory Visit

## 2013-01-31 ENCOUNTER — Ambulatory Visit (HOSPITAL_COMMUNITY): Admission: RE | Admit: 2013-01-31 | Payer: BC Managed Care – PPO | Source: Ambulatory Visit | Admitting: General Surgery

## 2013-01-31 SURGERY — BREATH TEST, FOR HELICOBACTER PYLORI

## 2014-10-12 ENCOUNTER — Ambulatory Visit: Payer: Self-pay | Admitting: Family Medicine

## 2014-10-14 ENCOUNTER — Encounter (HOSPITAL_COMMUNITY): Payer: Self-pay

## 2014-10-14 ENCOUNTER — Emergency Department (HOSPITAL_COMMUNITY)
Admission: EM | Admit: 2014-10-14 | Discharge: 2014-10-14 | Disposition: A | Discharge status: Home / Self Care | Payer: Medicaid Other | Attending: Emergency Medicine | Admitting: Emergency Medicine

## 2014-10-14 ENCOUNTER — Emergency Department (HOSPITAL_COMMUNITY): Payer: Medicaid Other

## 2014-10-14 DIAGNOSIS — E559 Vitamin D deficiency, unspecified: Secondary | ICD-10-CM | POA: Diagnosis not present

## 2014-10-14 DIAGNOSIS — E785 Hyperlipidemia, unspecified: Secondary | ICD-10-CM | POA: Diagnosis not present

## 2014-10-14 DIAGNOSIS — M791 Myalgia, unspecified site: Secondary | ICD-10-CM

## 2014-10-14 DIAGNOSIS — R51 Headache: Secondary | ICD-10-CM | POA: Diagnosis present

## 2014-10-14 DIAGNOSIS — Z79899 Other long term (current) drug therapy: Secondary | ICD-10-CM | POA: Diagnosis not present

## 2014-10-14 DIAGNOSIS — Z3202 Encounter for pregnancy test, result negative: Secondary | ICD-10-CM | POA: Diagnosis not present

## 2014-10-14 DIAGNOSIS — Z720 Tobacco use: Secondary | ICD-10-CM | POA: Diagnosis not present

## 2014-10-14 DIAGNOSIS — J069 Acute upper respiratory infection, unspecified: Secondary | ICD-10-CM

## 2014-10-14 LAB — BASIC METABOLIC PANEL
Anion gap: 7 (ref 5–15)
BUN: 8 mg/dL (ref 6–23)
CO2: 23 mmol/L (ref 19–32)
CREATININE: 0.83 mg/dL (ref 0.50–1.10)
Calcium: 7.9 mg/dL — ABNORMAL LOW (ref 8.4–10.5)
Chloride: 105 mmol/L (ref 96–112)
GFR, EST NON AFRICAN AMERICAN: 83 mL/min — AB (ref >90)
GLUCOSE: 99 mg/dL (ref 70–99)
Potassium: 3.1 mmol/L — ABNORMAL LOW (ref 3.5–5.1)
SODIUM: 135 mmol/L (ref 135–145)

## 2014-10-14 LAB — URINALYSIS, ROUTINE W REFLEX MICROSCOPIC
Bilirubin Urine: NEGATIVE
GLUCOSE, UA: NEGATIVE mg/dL
Hgb urine dipstick: NEGATIVE
Ketones, ur: NEGATIVE mg/dL
LEUKOCYTES UA: NEGATIVE
NITRITE: NEGATIVE
PH: 5.5 (ref 5.0–8.0)
Protein, ur: NEGATIVE mg/dL
SPECIFIC GRAVITY, URINE: 1.02 (ref 1.005–1.030)
Urobilinogen, UA: 0.2 mg/dL (ref 0.0–1.0)

## 2014-10-14 LAB — CBC
HCT: 33.7 % — ABNORMAL LOW (ref 36.0–46.0)
Hemoglobin: 10.9 g/dL — ABNORMAL LOW (ref 12.0–15.0)
MCH: 27.3 pg (ref 26.0–34.0)
MCHC: 32.3 g/dL (ref 30.0–36.0)
MCV: 84.3 fL (ref 78.0–100.0)
PLATELETS: 232 10*3/uL (ref 150–400)
RBC: 4 MIL/uL (ref 3.87–5.11)
RDW: 13.8 % (ref 11.5–15.5)
WBC: 4.7 10*3/uL (ref 4.0–10.5)

## 2014-10-14 LAB — POC URINE PREG, ED: PREG TEST UR: NEGATIVE

## 2014-10-14 LAB — I-STAT TROPONIN, ED: TROPONIN I, POC: 0 ng/mL (ref 0.00–0.08)

## 2014-10-14 LAB — I-STAT CG4 LACTIC ACID, ED: LACTIC ACID, VENOUS: 1.44 mmol/L (ref 0.5–2.0)

## 2014-10-14 MED ORDER — HYDROCODONE-ACETAMINOPHEN 5-325 MG PO TABS
2.0000 | ORAL_TABLET | Freq: Once | ORAL | Status: AC
  Administered 2014-10-14: 2 via ORAL
  Filled 2014-10-14: qty 2

## 2014-10-14 MED ORDER — HYDROCODONE-ACETAMINOPHEN 5-325 MG PO TABS: 1.0000 | ORAL_TABLET | Freq: Four times a day (QID) | ORAL | Status: AC | PRN

## 2014-10-14 MED ORDER — ACETAMINOPHEN 325 MG PO TABS
ORAL_TABLET | ORAL | Status: AC
  Filled 2014-10-14: qty 2

## 2014-10-14 MED ORDER — KETOROLAC TROMETHAMINE 30 MG/ML IJ SOLN
30.0000 mg | Freq: Once | INTRAMUSCULAR | Status: AC
  Administered 2014-10-14: 30 mg via INTRAVENOUS
  Filled 2014-10-14: qty 1

## 2014-10-14 MED ORDER — ACETAMINOPHEN 325 MG PO TABS
650.0000 mg | ORAL_TABLET | Freq: Four times a day (QID) | ORAL | Status: DC | PRN
  Administered 2014-10-14: 650 mg via ORAL

## 2014-10-14 MED ORDER — SODIUM CHLORIDE 0.9 % IV BOLUS (SEPSIS)
1000.0000 mL | Freq: Once | INTRAVENOUS | Status: AC
  Administered 2014-10-14: 1000 mL via INTRAVENOUS

## 2014-10-14 MED ORDER — ACETAMINOPHEN 500 MG PO TABS: 1000.0000 mg | ORAL_TABLET | Freq: Once | ORAL | Status: DC

## 2014-10-14 NOTE — ED Notes (Signed)
Pt. Reports started feeling bad last night and woke up this morning feeling worse. Reports chills/hot flashes, cp, body aches, HA. Denies n/v/d.

## 2014-10-14 NOTE — ED Notes (Signed)
NAD at this time.  

## 2014-10-14 NOTE — Discharge Instructions (Signed)
Upper Respiratory Infection, Adult An upper respiratory infection (URI) is also sometimes known as the common cold. The upper respiratory tract includes the nose, sinuses, throat, trachea, and bronchi. Bronchi are the airways leading to the lungs. Most people improve within 1 week, but symptoms can last up to 2 weeks. A residual cough may last even longer.  CAUSES Many different viruses can infect the tissues lining the upper respiratory tract. The tissues become irritated and inflamed and often become very moist. Mucus production is also common. A cold is contagious. You can easily spread the virus to others by oral contact. This includes kissing, sharing a glass, coughing, or sneezing. Touching your mouth or nose and then touching a surface, which is then touched by another person, can also spread the virus. SYMPTOMS  Symptoms typically develop 1 to 3 days after you come in contact with a cold virus. Symptoms vary from person to person. They may include:  Runny nose.  Sneezing.  Nasal congestion.  Sinus irritation.  Sore throat.  Loss of voice (laryngitis).  Cough.  Fatigue.  Muscle aches.  Loss of appetite.  Headache.  Low-grade fever. DIAGNOSIS  You might diagnose your own cold based on familiar symptoms, since most people get a cold 2 to 3 times a year. Your caregiver can confirm this based on your exam. Most importantly, your caregiver can check that your symptoms are not due to another disease such as strep throat, sinusitis, pneumonia, asthma, or epiglottitis. Blood tests, throat tests, and X-rays are not necessary to diagnose a common cold, but they may sometimes be helpful in excluding other more serious diseases. Your caregiver will decide if any further tests are required. RISKS AND COMPLICATIONS  You may be at risk for a more severe case of the common cold if you smoke cigarettes, have chronic heart disease (such as heart failure) or lung disease (such as asthma), or if  you have a weakened immune system. The very young and very old are also at risk for more serious infections. Bacterial sinusitis, middle ear infections, and bacterial pneumonia can complicate the common cold. The common cold can worsen asthma and chronic obstructive pulmonary disease (COPD). Sometimes, these complications can require emergency medical care and may be life-threatening. PREVENTION  The best way to protect against getting a cold is to practice good hygiene. Avoid oral or hand contact with people with cold symptoms. Wash your hands often if contact occurs. There is no clear evidence that vitamin C, vitamin E, echinacea, or exercise reduces the chance of developing a cold. However, it is always recommended to get plenty of rest and practice good nutrition. TREATMENT  Treatment is directed at relieving symptoms. There is no cure. Antibiotics are not effective, because the infection is caused by a virus, not by bacteria. Treatment may include:  Increased fluid intake. Sports drinks offer valuable electrolytes, sugars, and fluids.  Breathing heated mist or steam (vaporizer or shower).  Eating chicken soup or other clear broths, and maintaining good nutrition.  Getting plenty of rest.  Using gargles or lozenges for comfort.  Controlling fevers with ibuprofen or acetaminophen as directed by your caregiver.  Increasing usage of your inhaler if you have asthma. Zinc gel and zinc lozenges, taken in the first 24 hours of the common cold, can shorten the duration and lessen the severity of symptoms. Pain medicines may help with fever, muscle aches, and throat pain. A variety of non-prescription medicines are available to treat congestion and runny nose. Your caregiver   can make recommendations and may suggest nasal or lung inhalers for other symptoms.  HOME CARE INSTRUCTIONS   Only take over-the-counter or prescription medicines for pain, discomfort, or fever as directed by your  caregiver.  Use a warm mist humidifier or inhale steam from a shower to increase air moisture. This may keep secretions moist and make it easier to breathe.  Drink enough water and fluids to keep your urine clear or pale yellow.  Rest as needed.  Return to work when your temperature has returned to normal or as your caregiver advises. You may need to stay home longer to avoid infecting others. You can also use a face mask and careful hand washing to prevent spread of the virus. SEEK MEDICAL CARE IF:   After the first few days, you feel you are getting worse rather than better.  You need your caregiver's advice about medicines to control symptoms.  You develop chills, worsening shortness of breath, or brown or red sputum. These may be signs of pneumonia.  You develop yellow or brown nasal discharge or pain in the face, especially when you bend forward. These may be signs of sinusitis.  You develop a fever, swollen neck glands, pain with swallowing, or white areas in the back of your throat. These may be signs of strep throat. SEEK IMMEDIATE MEDICAL CARE IF:   You have a fever.  You develop severe or persistent headache, ear pain, sinus pain, or chest pain.  You develop wheezing, a prolonged cough, cough up blood, or have a change in your usual mucus (if you have chronic lung disease).  You develop sore muscles or a stiff neck. Document Released: 01/24/2001 Document Revised: 10/23/2011 Document Reviewed: 11/05/2013 Exit[employer] Patient Information 2015 [employer]. This information is not intended to replace advice given to you by your health care provider. Make sure you discuss any questions you have with your health care provider.  

## 2014-10-14 NOTE — ED Provider Notes (Signed)
Care assumed from Dr. Mingo Amber at shift change. Patient awaiting blood work which has returned essentially unremarkable. She is feeling better after fluids and pain medication and appears appropriate for discharge. She is to follow-up as needed.  Veryl Speak, MD 10/14/14 [phone]

## 2014-10-14 NOTE — ED Provider Notes (Signed)
CSN: [id number]     Arrival date & time 10/14/14  1413 History   First MD Initiated Contact with Patient 10/14/14 1528     Chief Complaint  Patient presents with  . Chest Pain  . Headache     (Consider location/radiation/quality/duration/timing/severity/associated sxs/prior Treatment) Patient is a 49 y.o. female presenting with chest pain, headaches, and URI. The history is provided by the patient.  Chest Pain Associated symptoms: fever and headache   Associated symptoms: no cough and no shortness of breath   Headache Associated symptoms: congestion, fever, sore throat and URI   Associated symptoms: no cough   URI Presenting symptoms: congestion, fever, rhinorrhea and sore throat   Presenting symptoms: no cough   Severity:  Moderate Onset quality:  Gradual Timing:  Constant Progression:  Unchanged Chronicity:  New Relieved by:  Nothing Worsened by:  Nothing tried Associated symptoms: headaches     Past Medical History  Diagnosis Date  . Hyperlipidemia   . Vitamin D deficiency disease   . Morbid obesity    Past Surgical History  Procedure Laterality Date  . Breast surgery  1993    reduction  . Tubal ligation  2003  . Foot surgery  2008     rt foot tarpals   . Knee surgery  2012    rt knee   . Laparoscopic cholecystectomy     Family History  Problem Relation Age of Onset  . Cancer Mother     ovarian ca  . Diabetes Father    History  Substance Use Topics  . Smoking status: Current Every Day Smoker -- 0.50 packs/day    Types: Cigarettes  . Smokeless tobacco: Not on file     Comment: 1 pack qd last two days  . Alcohol Use: Yes     Comment: Socially; 2-3 drinks/month   OB History    No data available     Review of Systems  Constitutional: Positive for fever.  HENT: Positive for congestion, rhinorrhea and sore throat.   Respiratory: Negative for cough and shortness of breath.   Cardiovascular: Positive for chest pain.  Neurological: Positive for  headaches.  All other systems reviewed and are negative.     Allergies  Review of patient's allergies indicates no known allergies.  Home Medications   Prior to Admission medications   Medication Sig Start Date End Date Taking? Authorizing Provider  AMBULATORY NON FORMULARY MEDICATION Take 1 tablet by mouth 2 (two) times daily. Medication Name: Prev Pac 11/12/12   Gayland Curry, MD  pravastatin (PRAVACHOL) 40 MG tablet Take 40 mg by mouth daily.  09/27/12   Historical Provider, MD  Vitamin D, Ergocalciferol, (DRISDOL) 50000 UNITS CAPS Take 50,000 Units by mouth 2 (two) times a week.  10/07/12   Historical Provider, MD   BP 124/64 mmHg  Pulse 113  Temp(Src) 103.1 F (39.5 C) (Oral)  Resp 16  Ht 5\' 7"  (1.702 m)  Wt 220 lb (99.791 kg)  BMI 34.45 kg/m2  SpO2 96%  LMP 10/07/2014 Physical Exam  Constitutional: She is oriented to person, place, and time. She appears well-developed and well-nourished. No distress.  HENT:  Head: Normocephalic and atraumatic.  Right Ear: Tympanic membrane normal.  Left Ear: Tympanic membrane normal.  Mouth/Throat: Oropharynx is clear and moist.  Eyes: EOM are normal. Pupils are equal, round, and reactive to light.  Neck: Normal range of motion. Neck supple.  No meningeal signs No lymphadenopathy  Cardiovascular: Normal rate and regular rhythm.  Exam  reveals no friction rub.   No murmur heard. Pulmonary/Chest: Effort normal and breath sounds normal. No respiratory distress. She has no wheezes. She has no rales.  Abdominal: Soft. She exhibits no distension. There is no tenderness. There is no rebound.  Musculoskeletal: Normal range of motion. She exhibits no edema.  Neurological: She is alert and oriented to person, place, and time.  Skin: She is not diaphoretic.  Nursing note and vitals reviewed.   ED Course  Procedures (including critical care time) Labs Review Labs Reviewed  CULTURE, BLOOD (ROUTINE X 2)  CULTURE, BLOOD (ROUTINE X 2)   URINALYSIS, ROUTINE W REFLEX MICROSCOPIC  I-STAT CG4 LACTIC ACID, ED  POC URINE PREG, ED  Randolm Idol, ED    Imaging Review Dg Chest 2 View  10/14/2014   CLINICAL DATA:  Chest pain with body aches and chills.  EXAM: CHEST  2 VIEW  COMPARISON:  11/08/2012.  FINDINGS: Normal cardiomediastinal silhouette. Clear lung fields. No bony abnormality. Mild chronic basilar scarring. Stable appearance from priors.  IMPRESSION: No active cardiopulmonary disease.   Electronically Signed   By: Rolla Flatten M.D.   On: 10/14/2014 15:22     EKG Interpretation   Date/Time:  Wednesday October 14 2014 14:17:05 EST Ventricular Rate:  109 PR Interval:  146 QRS Duration: 78 QT Interval:  296 QTC Calculation: 398 R Axis:   73 Text Interpretation:  Sinus tachycardia T wave abnormality, consider  inferior ischemia , new since prior tracing Abnormal ECG Confirmed by  KNAPP  MD-J, JON ([id number]) on 10/14/2014 2:24:02 PM      MDM   Final diagnoses:  URI (upper respiratory infection)  Myalgia    2F here with URI symptoms - runny nose, sore throat, cough, congestion, fever, headache. Began last night, worsening. Husband with similar symptoms last week. Here febrile, mildly tachycardic. BP normal. Neck supple, full ROM, no meningeal signs.  Normal oropharynx, normal exam otherwise.  Clinical picture c/w URI. Plan for fluids, labs, re-eval. CXR clear. Will continue with fluids, narcotics. Will continue hydration, expect discharge.  Evelina Bucy, MD 10/14/14 [phone]

## 2014-10-17 ENCOUNTER — Inpatient Hospital Stay (HOSPITAL_COMMUNITY): Payer: Medicaid Other

## 2014-10-17 ENCOUNTER — Emergency Department (HOSPITAL_COMMUNITY): Payer: Medicaid Other

## 2014-10-17 ENCOUNTER — Encounter (HOSPITAL_COMMUNITY): Payer: Self-pay | Admitting: Nurse Practitioner

## 2014-10-17 ENCOUNTER — Inpatient Hospital Stay (HOSPITAL_COMMUNITY)
Admission: EM | Admit: 2014-10-17 | Discharge: 2014-11-13 | DRG: 871 | Disposition: E | Payer: Medicaid Other | Attending: Pulmonary Disease | Admitting: Pulmonary Disease

## 2014-10-17 DIAGNOSIS — J9601 Acute respiratory failure with hypoxia: Secondary | ICD-10-CM | POA: Diagnosis present

## 2014-10-17 DIAGNOSIS — J189 Pneumonia, unspecified organism: Secondary | ICD-10-CM | POA: Diagnosis present

## 2014-10-17 DIAGNOSIS — E872 Acidosis, unspecified: Secondary | ICD-10-CM | POA: Diagnosis present

## 2014-10-17 DIAGNOSIS — E559 Vitamin D deficiency, unspecified: Secondary | ICD-10-CM | POA: Diagnosis present

## 2014-10-17 DIAGNOSIS — E876 Hypokalemia: Secondary | ICD-10-CM | POA: Diagnosis present

## 2014-10-17 DIAGNOSIS — E66813 Obesity, class 3: Secondary | ICD-10-CM | POA: Diagnosis present

## 2014-10-17 DIAGNOSIS — R739 Hyperglycemia, unspecified: Secondary | ICD-10-CM | POA: Diagnosis present

## 2014-10-17 DIAGNOSIS — Z6841 Body Mass Index (BMI) 40.0 and over, adult: Secondary | ICD-10-CM | POA: Diagnosis not present

## 2014-10-17 DIAGNOSIS — R6521 Severe sepsis with septic shock: Secondary | ICD-10-CM | POA: Diagnosis present

## 2014-10-17 DIAGNOSIS — Z87891 Personal history of nicotine dependence: Secondary | ICD-10-CM | POA: Diagnosis not present

## 2014-10-17 DIAGNOSIS — J80 Acute respiratory distress syndrome: Secondary | ICD-10-CM | POA: Diagnosis not present

## 2014-10-17 DIAGNOSIS — Z66 Do not resuscitate: Secondary | ICD-10-CM | POA: Diagnosis not present

## 2014-10-17 DIAGNOSIS — I469 Cardiac arrest, cause unspecified: Secondary | ICD-10-CM | POA: Diagnosis not present

## 2014-10-17 DIAGNOSIS — R34 Anuria and oliguria: Secondary | ICD-10-CM | POA: Diagnosis not present

## 2014-10-17 DIAGNOSIS — E7800 Pure hypercholesterolemia, unspecified: Secondary | ICD-10-CM | POA: Diagnosis present

## 2014-10-17 DIAGNOSIS — Z79899 Other long term (current) drug therapy: Secondary | ICD-10-CM | POA: Diagnosis not present

## 2014-10-17 DIAGNOSIS — A419 Sepsis, unspecified organism: Principal | ICD-10-CM | POA: Diagnosis present

## 2014-10-17 DIAGNOSIS — R05 Cough: Secondary | ICD-10-CM | POA: Diagnosis present

## 2014-10-17 DIAGNOSIS — N170 Acute kidney failure with tubular necrosis: Secondary | ICD-10-CM | POA: Diagnosis not present

## 2014-10-17 DIAGNOSIS — Z4659 Encounter for fitting and adjustment of other gastrointestinal appliance and device: Secondary | ICD-10-CM

## 2014-10-17 DIAGNOSIS — E860 Dehydration: Secondary | ICD-10-CM | POA: Diagnosis present

## 2014-10-17 DIAGNOSIS — E785 Hyperlipidemia, unspecified: Secondary | ICD-10-CM | POA: Diagnosis present

## 2014-10-17 DIAGNOSIS — R059 Cough, unspecified: Secondary | ICD-10-CM

## 2014-10-17 DIAGNOSIS — R509 Fever, unspecified: Secondary | ICD-10-CM | POA: Diagnosis present

## 2014-10-17 DIAGNOSIS — J9600 Acute respiratory failure, unspecified whether with hypoxia or hypercapnia: Secondary | ICD-10-CM

## 2014-10-17 DIAGNOSIS — R112 Nausea with vomiting, unspecified: Secondary | ICD-10-CM | POA: Diagnosis present

## 2014-10-17 DIAGNOSIS — R579 Shock, unspecified: Secondary | ICD-10-CM | POA: Diagnosis present

## 2014-10-17 LAB — COMPREHENSIVE METABOLIC PANEL
ALT: 23 U/L (ref 0–35)
AST: 34 U/L (ref 0–37)
Albumin: 3.1 g/dL — ABNORMAL LOW (ref 3.5–5.2)
Alkaline Phosphatase: 39 U/L (ref 39–117)
Anion gap: 9 (ref 5–15)
BUN: 6 mg/dL (ref 6–23)
CHLORIDE: 103 mmol/L (ref 96–112)
CO2: 24 mmol/L (ref 19–32)
CREATININE: 0.78 mg/dL (ref 0.50–1.10)
Calcium: 7.7 mg/dL — ABNORMAL LOW (ref 8.4–10.5)
Glucose, Bld: 141 mg/dL — ABNORMAL HIGH (ref 70–99)
POTASSIUM: 3.2 mmol/L — AB (ref 3.5–5.1)
Sodium: 136 mmol/L (ref 135–145)
Total Bilirubin: 0.4 mg/dL (ref 0.3–1.2)
Total Protein: 6.3 g/dL (ref 6.0–8.3)

## 2014-10-17 LAB — CBC WITH DIFFERENTIAL/PLATELET
BASOS PCT: 0 % (ref 0–1)
Basophils Absolute: 0 10*3/uL (ref 0.0–0.1)
EOS ABS: 0 10*3/uL (ref 0.0–0.7)
Eosinophils Relative: 0 % (ref 0–5)
HCT: 44.4 % (ref 36.0–46.0)
HEMOGLOBIN: 14.2 g/dL (ref 12.0–15.0)
LYMPHS PCT: 17 % (ref 12–46)
Lymphs Abs: 1 10*3/uL (ref 0.7–4.0)
MCH: 27.5 pg (ref 26.0–34.0)
MCHC: 32 g/dL (ref 30.0–36.0)
MCV: 86 fL (ref 78.0–100.0)
MONO ABS: 0.4 10*3/uL (ref 0.1–1.0)
MONOS PCT: 6 % (ref 3–12)
Neutro Abs: 4.6 10*3/uL (ref 1.7–7.7)
Neutrophils Relative %: 77 % (ref 43–77)
Platelets: 251 10*3/uL (ref 150–400)
RBC: 5.16 MIL/uL — AB (ref 3.87–5.11)
RDW: 14.5 % (ref 11.5–15.5)
WBC: 6 10*3/uL (ref 4.0–10.5)

## 2014-10-17 LAB — URINALYSIS, ROUTINE W REFLEX MICROSCOPIC
GLUCOSE, UA: NEGATIVE mg/dL
Hgb urine dipstick: NEGATIVE
Ketones, ur: 15 mg/dL — AB
NITRITE: POSITIVE — AB
PH: 6 (ref 5.0–8.0)
Protein, ur: 100 mg/dL — AB
Specific Gravity, Urine: 1.031 — ABNORMAL HIGH (ref 1.005–1.030)
Urobilinogen, UA: 1 mg/dL (ref 0.0–1.0)

## 2014-10-17 LAB — GLUCOSE, CAPILLARY: GLUCOSE-CAPILLARY: 178 mg/dL — AB (ref 70–99)

## 2014-10-17 LAB — I-STAT CG4 LACTIC ACID, ED
LACTIC ACID, VENOUS: 2.66 mmol/L — AB (ref 0.5–2.0)
LACTIC ACID, VENOUS: 5.88 mmol/L — AB (ref 0.5–2.0)
Lactic Acid, Venous: 2.91 mmol/L (ref 0.5–2.0)
Lactic Acid, Venous: 2.94 mmol/L (ref 0.5–2.0)

## 2014-10-17 LAB — URINE MICROSCOPIC-ADD ON

## 2014-10-17 LAB — STREP PNEUMONIAE URINARY ANTIGEN: Strep Pneumo Urinary Antigen: NEGATIVE

## 2014-10-17 LAB — CORTISOL: CORTISOL PLASMA: 25.5 ug/dL

## 2014-10-17 LAB — TROPONIN I: Troponin I: 0.45 ng/mL — ABNORMAL HIGH (ref <0.031)

## 2014-10-17 LAB — LACTIC ACID, PLASMA: LACTIC ACID, VENOUS: 4 mmol/L — AB (ref 0.5–2.0)

## 2014-10-17 LAB — MRSA PCR SCREENING: MRSA BY PCR: NEGATIVE

## 2014-10-17 MED ORDER — MORPHINE SULFATE 4 MG/ML IJ SOLN
4.0000 mg | Freq: Once | INTRAMUSCULAR | Status: AC
  Administered 2014-10-17: 4 mg via INTRAVENOUS
  Filled 2014-10-17: qty 1

## 2014-10-17 MED ORDER — SODIUM CHLORIDE 0.9 % IV BOLUS (SEPSIS)
1000.0000 mL | Freq: Once | INTRAVENOUS | Status: AC
  Administered 2014-10-17: 1000 mL via INTRAVENOUS

## 2014-10-17 MED ORDER — HYDROCODONE-ACETAMINOPHEN 5-325 MG PO TABS
1.0000 | ORAL_TABLET | Freq: Four times a day (QID) | ORAL | Status: DC | PRN
  Administered 2014-10-17: 1 via ORAL
  Filled 2014-10-17: qty 1

## 2014-10-17 MED ORDER — MORPHINE SULFATE 2 MG/ML IJ SOLN
INTRAMUSCULAR | Status: AC
  Filled 2014-10-17: qty 1

## 2014-10-17 MED ORDER — SODIUM CHLORIDE 0.9 % IV SOLN
1500.0000 mg | Freq: Once | INTRAVENOUS | Status: AC
  Administered 2014-10-17: 1500 mg via INTRAVENOUS
  Filled 2014-10-17 (×2): qty 1500

## 2014-10-17 MED ORDER — DEXTROSE 5 % IV SOLN
500.0000 mg | INTRAVENOUS | Status: DC
Start: 2014-10-17 — End: 2014-10-19
  Administered 2014-10-18: 500 mg via INTRAVENOUS
  Filled 2014-10-17 (×3): qty 500

## 2014-10-17 MED ORDER — SODIUM CHLORIDE 0.9 % IV SOLN
1.0000 g | Freq: Once | INTRAVENOUS | Status: AC
  Administered 2014-10-17: 1 g via INTRAVENOUS
  Filled 2014-10-17: qty 10

## 2014-10-17 MED ORDER — NOREPINEPHRINE BITARTRATE 1 MG/ML IV SOLN
0.0000 ug/min | INTRAVENOUS | Status: DC
  Administered 2014-10-17: 2 ug/min via INTRAVENOUS

## 2014-10-17 MED ORDER — POTASSIUM CHLORIDE 20 MEQ PO PACK
40.0000 meq | PACK | Freq: Once | ORAL | Status: DC
  Filled 2014-10-17: qty 2

## 2014-10-17 MED ORDER — LIDOCAINE HCL (PF) 1 % IJ SOLN
INTRAMUSCULAR | Status: AC
  Administered 2014-10-17: 20:00:00
  Filled 2014-10-17: qty 5

## 2014-10-17 MED ORDER — HYDROCORTISONE NA SUCCINATE PF 100 MG IJ SOLR
50.0000 mg | Freq: Three times a day (TID) | INTRAMUSCULAR | Status: DC
  Administered 2014-10-17 – 2014-10-19 (×5): 50 mg via INTRAVENOUS
  Filled 2014-10-17: qty 1
  Filled 2014-10-17: qty 2
  Filled 2014-10-17: qty 1
  Filled 2014-10-17: qty 2
  Filled 2014-10-17 (×4): qty 1

## 2014-10-17 MED ORDER — ONDANSETRON HCL 4 MG/2ML IJ SOLN
4.0000 mg | Freq: Once | INTRAMUSCULAR | Status: AC
  Administered 2014-10-17: 4 mg via INTRAVENOUS
  Filled 2014-10-17: qty 2

## 2014-10-17 MED ORDER — DEXTROSE 5 % IV SOLN
1.0000 g | Freq: Once | INTRAVENOUS | Status: AC
  Administered 2014-10-17: 1 g via INTRAVENOUS
  Filled 2014-10-17: qty 10

## 2014-10-17 MED ORDER — POTASSIUM CHLORIDE 10 MEQ/50ML IV SOLN
10.0000 meq | INTRAVENOUS | Status: AC
  Administered 2014-10-17 – 2014-10-18 (×4): 10 meq via INTRAVENOUS
  Filled 2014-10-17 (×4): qty 50

## 2014-10-17 MED ORDER — ENOXAPARIN SODIUM 40 MG/0.4ML ~~LOC~~ SOLN
40.0000 mg | SUBCUTANEOUS | Status: DC
  Administered 2014-10-17: 40 mg via SUBCUTANEOUS
  Filled 2014-10-17 (×2): qty 0.4

## 2014-10-17 MED ORDER — POTASSIUM CHLORIDE 20 MEQ/15ML (10%) PO SOLN
40.0000 meq | Freq: Once | ORAL | Status: AC
  Administered 2014-10-17: 40 meq via ORAL
  Filled 2014-10-17: qty 30

## 2014-10-17 MED ORDER — SODIUM CHLORIDE 0.9 % IV SOLN
INTRAVENOUS | Status: DC
  Administered 2014-10-17: 16:00:00 via INTRAVENOUS

## 2014-10-17 MED ORDER — CEFTRIAXONE SODIUM 1 G IJ SOLR
1.0000 g | INTRAMUSCULAR | Status: DC
Start: 2014-10-17 — End: 2014-10-17

## 2014-10-17 MED ORDER — ONDANSETRON HCL 4 MG/2ML IJ SOLN
4.0000 mg | Freq: Four times a day (QID) | INTRAMUSCULAR | Status: DC | PRN
  Filled 2014-10-17: qty 2

## 2014-10-17 MED ORDER — SODIUM CHLORIDE 0.9 % IV BOLUS (SEPSIS)
1000.0000 mL | INTRAVENOUS | Status: DC | PRN
  Administered 2014-10-17: 1000 mL via INTRAVENOUS

## 2014-10-17 MED ORDER — INSULIN ASPART 100 UNIT/ML ~~LOC~~ SOLN
1.0000 [IU] | SUBCUTANEOUS | Status: DC
  Administered 2014-10-17 – 2014-10-18 (×4): 2 [IU] via SUBCUTANEOUS
  Administered 2014-10-18 – 2014-10-19 (×4): 3 [IU] via SUBCUTANEOUS
  Administered 2014-10-19: 2 [IU] via SUBCUTANEOUS

## 2014-10-17 MED ORDER — PROMETHAZINE HCL 25 MG/ML IJ SOLN
25.0000 mg | Freq: Four times a day (QID) | INTRAMUSCULAR | Status: DC | PRN
  Filled 2014-10-17 (×2): qty 1

## 2014-10-17 MED ORDER — MORPHINE SULFATE 2 MG/ML IJ SOLN
1.0000 mg | INTRAMUSCULAR | Status: DC | PRN
  Administered 2014-10-17 – 2014-10-18 (×2): 1 mg via INTRAVENOUS
  Filled 2014-10-17: qty 1

## 2014-10-17 MED ORDER — OSELTAMIVIR PHOSPHATE 75 MG PO CAPS
75.0000 mg | ORAL_CAPSULE | Freq: Two times a day (BID) | ORAL | Status: DC
  Administered 2014-10-17: 75 mg via ORAL
  Filled 2014-10-17 (×3): qty 1

## 2014-10-17 MED ORDER — VANCOMYCIN HCL IN DEXTROSE 1-5 GM/200ML-% IV SOLN
1000.0000 mg | Freq: Three times a day (TID) | INTRAVENOUS | Status: DC
  Administered 2014-10-18: 1000 mg via INTRAVENOUS
  Filled 2014-10-17 (×3): qty 200

## 2014-10-17 MED ORDER — ACETAMINOPHEN 325 MG PO TABS
650.0000 mg | ORAL_TABLET | Freq: Four times a day (QID) | ORAL | Status: DC | PRN
Start: 2014-10-17 — End: 2014-10-18

## 2014-10-17 MED ORDER — ALBUTEROL SULFATE (2.5 MG/3ML) 0.083% IN NEBU
5.0000 mg | INHALATION_SOLUTION | Freq: Once | RESPIRATORY_TRACT | Status: AC
  Administered 2014-10-17: 5 mg via RESPIRATORY_TRACT
  Filled 2014-10-17: qty 6

## 2014-10-17 MED ORDER — DEXTROSE 5 % IV SOLN
500.0000 mg | Freq: Once | INTRAVENOUS | Status: AC
  Administered 2014-10-17: 500 mg via INTRAVENOUS
  Filled 2014-10-17: qty 500

## 2014-10-17 MED ORDER — ONDANSETRON HCL 4 MG/2ML IJ SOLN
4.0000 mg | Freq: Four times a day (QID) | INTRAMUSCULAR | Status: DC | PRN
  Administered 2014-10-17: 4 mg via INTRAVENOUS

## 2014-10-17 MED ORDER — DEXTROSE 5 % IV SOLN
0.0000 ug/min | INTRAVENOUS | Status: DC
  Administered 2014-10-18 (×2): 30 ug/min via INTRAVENOUS
  Administered 2014-10-19: 50 ug/min via INTRAVENOUS
  Filled 2014-10-17 (×5): qty 16

## 2014-10-17 MED ORDER — DEXTROSE 5 % IV SOLN
2.0000 g | Freq: Three times a day (TID) | INTRAVENOUS | Status: DC
  Administered 2014-10-17 – 2014-10-18 (×2): 2 g via INTRAVENOUS
  Filled 2014-10-17 (×4): qty 2

## 2014-10-17 MED ORDER — IPRATROPIUM BROMIDE 0.02 % IN SOLN
0.5000 mg | Freq: Once | RESPIRATORY_TRACT | Status: AC
  Administered 2014-10-17: 0.5 mg via RESPIRATORY_TRACT
  Filled 2014-10-17: qty 2.5

## 2014-10-17 MED ORDER — KETOROLAC TROMETHAMINE 30 MG/ML IJ SOLN
30.0000 mg | Freq: Once | INTRAMUSCULAR | Status: AC
Start: 2014-10-17 — End: 2014-10-17
  Administered 2014-10-17: 30 mg via INTRAVENOUS
  Filled 2014-10-17: qty 1

## 2014-10-17 NOTE — ED Notes (Signed)
I Stat Lactic Acid results shown to Dr. Betsey Holiday

## 2014-10-17 NOTE — ED Provider Notes (Addendum)
CSN: [id number]     Arrival date & time 10/15/2014  1059 History   First MD Initiated Contact with Patient 10/25/2014 1203     Chief Complaint  Patient presents with  . URI  . Emesis     (Consider location/radiation/quality/duration/timing/severity/associated sxs/prior Treatment) HPI   PCP: Lilian Coma, MD Blood pressure 112/70, pulse 123, temperature 98.9 F (37.2 C), temperature source Oral, resp. rate 27, last menstrual period 10/07/2014, SpO2 95 %.  [name] is a 49 y.o.female with a significant PMH of hyperlipidemia, vitamin D, morbid obesity presents to the ER with complaints of fever at home, sore throat, headache, chest pain, cough, congestion, myalgias. She was seen on 3/2 and diagnosed with URI, at that time she had a negative chest xray and her blood work was unremarkable. Since discharge she has continued to feel worse and is now having nausea, vomiting and diarrhea. Her chest pain is getting worse with palpation, cough and large breath. In triage she is tachycardi and  tachypnic, but afebrile with a stable BP. She denies recent admission within the past 2 months -  Healthy at baseline.  Negative Review of Symptoms: confusion, AMS, back pain, change in vision, focal weakness, vaginal bleeding/discharge, rash, abdominal pain.   Past Medical History  Diagnosis Date  . Hyperlipidemia   . Vitamin D deficiency disease   . Morbid obesity    Past Surgical History  Procedure Laterality Date  . Breast surgery  1993    reduction  . Tubal ligation  2003  . Foot surgery  2008     rt foot tarpals   . Knee surgery  2012    rt knee   . Laparoscopic cholecystectomy     Family History  Problem Relation Age of Onset  . Cancer Mother     ovarian ca  . Diabetes Father    History  Substance Use Topics  . Smoking status: Former Smoker -- 0.50 packs/day    Types: Cigarettes    Quit date: 07/01/2014  . Smokeless tobacco: Not on file     Comment: 1 pack qd last two days   . Alcohol Use: Yes     Comment: Socially; 2-3 drinks/month   OB History    No data available     Review of Systems  10 Systems reviewed and are negative for acute change except as noted in the HPI.    Allergies  Review of patient's allergies indicates no known allergies.  Home Medications   Prior to Admission medications   Medication Sig Start Date End Date Taking? Authorizing Provider  AMBULATORY NON FORMULARY MEDICATION Take 1 tablet by mouth 2 (two) times daily. Medication Name: Prev Pac 11/12/12  Yes Gayland Curry, MD  HYDROcodone-acetaminophen (NORCO/VICODIN) 5-325 MG per tablet Take 1 tablet by mouth every 6 (six) hours as needed for moderate pain. 10/14/14  Yes Evelina Bucy, MD  pravastatin (PRAVACHOL) 40 MG tablet Take 40 mg by mouth daily.  09/27/12  Yes Historical Provider, MD  Vitamin D, Ergocalciferol, (DRISDOL) 50000 UNITS CAPS Take 50,000 Units by mouth 2 (two) times a week.  10/07/12  Yes Historical Provider, MD   BP 112/70 mmHg  Pulse 123  Temp(Src) 98.9 F (37.2 C) (Oral)  Resp 27  SpO2 95%  LMP 10/07/2014 Physical Exam  Constitutional: She is oriented to person, place, and time. She appears well-developed and well-nourished. She has a sickly appearance.  HENT:  Head: Normocephalic and atraumatic.  Right Ear: External ear normal.  Left  Ear: External ear normal.  Nose: Nose normal.  Eyes: Pupils are equal, round, and reactive to light.  Neck: Normal range of motion. Neck supple. No spinous process tenderness and no muscular tenderness present. No Brudzinski's sign and no Kernig's sign noted.  Cardiovascular: Regular rhythm.  Tachycardia present.   Pulmonary/Chest: Effort normal. No accessory muscle usage. No respiratory distress. She has no decreased breath sounds. She has no wheezes. She has rhonchi.  Abdominal: Soft. Bowel sounds are normal. She exhibits no distension. There is no tenderness. There is no rebound and no guarding.  Neurological: She is alert and  oriented to person, place, and time. She has normal strength.  Skin: Skin is warm. No rash noted. She is diaphoretic.  Psychiatric: Her speech is normal.  Nursing note and vitals reviewed.   ED Course  Procedures (including critical care time) Labs Review Labs Reviewed  CBC WITH DIFFERENTIAL/PLATELET - Abnormal; Notable for the following:    RBC 5.16 (*)    All other components within normal limits  COMPREHENSIVE METABOLIC PANEL - Abnormal; Notable for the following:    Potassium 3.2 (*)    Glucose, Bld 141 (*)    Calcium 7.7 (*)    Albumin 3.1 (*)    All other components within normal limits  I-STAT CG4 LACTIC ACID, ED - Abnormal; Notable for the following:    Lactic Acid, Venous 2.66 (*)    All other components within normal limits  CULTURE, BLOOD (ROUTINE X 2)  CULTURE, BLOOD (ROUTINE X 2)  URINE CULTURE  URINALYSIS, ROUTINE W REFLEX MICROSCOPIC  I-STAT CG4 LACTIC ACID, ED    Imaging Review Dg Chest 2 View  11/02/2014   CLINICAL DATA:  Upper respiratory infection subsequent evaluation, not improving with antibiotics, now with nausea vomiting and diarrhea  EXAM: CHEST  2 VIEW  COMPARISON:  10/14/2014  FINDINGS: Heart size and vascular pattern normal. Bilateral infiltrate in the midlung zones. No pleural effusion or evidence of pulmonary edema. Bony thorax intact.  IMPRESSION: New bilateral mid lung zone infiltrates concerning for bilateral pneumonia.   Electronically Signed   By: Skipper Cliche M.D.   On: 11/10/2014 14:14     EKG Interpretation None      MDM   Final diagnoses:  Cough  Sepsis, due to unspecified organism  Bilateral pneumonia    Pt meets Code Sepsis criteria.  Medications  cefTRIAXone (ROCEPHIN) 1 g in dextrose 5 % 50 mL IVPB (1 g Intravenous New Bag/Given 11/02/2014 1453)  azithromycin (ZITHROMAX) 500 mg in dextrose 5 % 250 mL IVPB (500 mg Intravenous New Bag/Given 11/08/2014 1457)  sodium chloride 0.9 % bolus 1,000 mL (1,000 mLs Intravenous New Bag/Given  10/25/2014 1450)  ondansetron (ZOFRAN) injection 4 mg (4 mg Intravenous Given 11/04/2014 1347)  morphine 4 MG/ML injection 4 mg (4 mg Intravenous Given 10/22/2014 1347)  sodium chloride 0.9 % bolus 1,000 mL (1,000 mLs Intravenous New Bag/Given 10/24/2014 1347)  albuterol (PROVENTIL) (2.5 MG/3ML) 0.083% nebulizer solution 5 mg (5 mg Nebulization Given 10/16/2014 1317)  ipratropium (ATROVENT) nebulizer solution 0.5 mg (0.5 mg Nebulization Given 10/22/2014 1317)  ketorolac (TORADOL) 30 MG/ML injection 30 mg (30 mg Intravenous Given 11/07/2014 1450)    CRITICAL CARE Performed by: Linus Mako Total critical care time: 45 min Critical care time was exclusive of separately billable procedures and treating other patients. Critical care was necessary to treat or prevent imminent or life-threatening deterioration. Critical care was time spent personally by me on the following activities: development of treatment  plan with patient and/or surrogate as well as nursing, discussions with consultants, evaluation of patient's response to treatment, examination of patient, obtaining history from patient or surrogate, ordering and performing treatments and interventions, ordering and review of laboratory studies, ordering and review of radiographic studies, pulse oximetry and re-evaluation of patient's condition.  2:50pm Patients oxygen levels improved with 3 L [place] from 89-90% on room air to 95% on nasal cannula.  Her chest xray shows an bilateral pneumonia.  She has an elevated lactic acid. Dr. Jeanell Sparrow made aware of patient.  At this time she is maintaining her own airway and mentating at baseline. Pt understands finding and need for admission, she is agreeable to this. Pt admitted to [facility], inpatient, medical bed, I discussed case with Ebony Hail, NP with [facility] hospitalists.  Filed Vitals:   10/23/2014 1345  BP: 112/70  Pulse: 123  Temp:   Resp: [address] PA-C 10/25/2014 1453  Linus Mako, PA-C 10/30/2014  1518  Shaune Pollack, MD 10/23/2014 1525  Linus Mako, PA-C 10/24/2014 [place], MD 10/18/14 [phone]

## 2014-10-17 NOTE — Progress Notes (Signed)
Notify Dr. Stevenson Clinch that we have admitted this patient to the stepdown unit but request a formal intensivist evaluation for possible transfer to the ICU given her tenuous status and recurrent hypotension despite 3 L plus fluid challenge. He has agreed to come see the patient in consultation later tonight. Current systolic blood pressure around 85. With sinus tachycardia ventricular rates 110s.  Erin Hearing, ANP

## 2014-10-17 NOTE — ED Notes (Addendum)
Pricilla Loveless, MD CCM paged, per verbal order, Lissa Merlin, NP to be contacted, pt to receive NS bolus 1 Liter, & the pt is to be re-evaluated, Pollina, MD & Lenna Sciara, Charge RN made aware, Pollina, Md at bedside, pt aware of plan of care

## 2014-10-17 NOTE — Progress Notes (Signed)
CRITICAL VALUE ALERT  Critical value received:  Troponin 0.45 and Latic Acid 4.0  Date of notification:  10/21/2014  Time of notification:  2225  Critical value read back:Yes.    Nurse who received alert:  Allegra Grana, RN  MD notified (1st page):  Warren Lacy MD  Time of first page:  2228  MD notified (2nd page):  Time of second page:  Responding MD:  Warren Lacy MD  Time MD responded:  2228

## 2014-10-17 NOTE — ED Notes (Signed)
I Stat Lactic Acid results shown to Delos Haring PA

## 2014-10-17 NOTE — ED Notes (Signed)
Attempted to give report 

## 2014-10-17 NOTE — H&P (Signed)
[facility] History and Physical                                                                                    [name], is a 49 y.o. female  MRN: [id number]   DOB - 1966/04/12  Admit Date - 11/10/2014  Outpatient Primary MD for the patient is Lilian Coma, MD  With History of -  Past Medical History  Diagnosis Date  . Hyperlipidemia   . Vitamin D deficiency disease   . Morbid obesity       Past Surgical History  Procedure Laterality Date  . Breast surgery  1993    reduction  . Tubal ligation  2003  . Foot surgery  2008     rt foot tarpals   . Knee surgery  2012    rt knee   . Laparoscopic cholecystectomy      in for   Chief Complaint  Patient presents with  . URI  . Emesis     HPI [name]  is a 49 y.o. female, past medical history consistent with obesity, dysfunctional uterine bleeding, mild anemia, uterine fibroids, dyslipidemia and vitamin D deficiency. She initially presented to the emergency department on 3/2 the complaint of chest pain and headache and symptoms consistent with an upper respiratory infection. At that time she had a temperature of 103, her blood pressure was 124/64 her pulse was 113. Chest x-ray not consistent with pneumonia or other condition requiring hospitalization so patient was discharged home with recommendations to increase fluids, utilized a prescription for pain medications. She returns today with similar complaints now with more progressive shortness of breath and recurrent nausea and vomiting. Upon presentation she was afebrile but her pulse was 123 and her blood pressure was 112/70. Since discharge she has progressively worsened and is now developed worsened pleuritic chest pain as well as anterior chest wall pain with palpation, productive cough clear sputum and shortness of breath. In triage she was hypoxic so oxygen was applied. Her initial lactic acid of presentation was 2.66 and several hours later was still  somewhat elevated at 2.94. She was hypokalemic with a potassium of 3.2. Her renal function was stable and at baseline. Her albumin was slightly low at 3.1 consistent with a reported history of nausea and vomiting. She had no leukocytosis. Chest x-ray today demonstrated new bilateral mid lung zone infiltrates concerning for bilateral pneumonia.  Upon my evaluation of the patient she reports she did not take a flu shot this year. She is also had recurrent fevers with chills at home and is now also developed diarrhea. She attempted to drink some more issues at home today but vomited around 10 AM.  Review of Systems   In addition to the HPI above,  No Headache, changes with Vision or hearing, new weakness, tingling, numbness in any extremity, No problems swallowing food or Liquids, indigestion/reflux No dysuria, hematuria or flank pain No new skin rashes, lesions, masses or bruises, No new joints pains-aches No recent weight gain or loss No polyuria, polydypsia or polyphagia,  *A full 10 point Review of Systems was done, except as stated above, all other Review of  Systems were negative.  Social History History  Substance Use Topics  . Smoking status: Former Smoker -- 0.50 packs/day    Types: Cigarettes    Quit date: 07/01/2014  . Smokeless tobacco: Not on file     Comment: 1 pack qd last two days  . Alcohol Use: Yes     Comment: Socially; 2-3 drinks/month    Family History Family History  Problem Relation Age of Onset  . Cancer Mother     ovarian ca  . Diabetes Father     Prior to Admission medications   Medication Sig Start Date End Date Taking? Authorizing Provider  AMBULATORY NON FORMULARY MEDICATION Take 1 tablet by mouth 2 (two) times daily. Medication Name: Prev Pac 11/12/12  Yes Gayland Curry, MD  HYDROcodone-acetaminophen (NORCO/VICODIN) 5-325 MG per tablet Take 1 tablet by mouth every 6 (six) hours as needed for moderate pain. 10/14/14  Yes Evelina Bucy, MD  pravastatin  (PRAVACHOL) 40 MG tablet Take 40 mg by mouth daily.  09/27/12  Yes Historical Provider, MD  Vitamin D, Ergocalciferol, (DRISDOL) 50000 UNITS CAPS Take 50,000 Units by mouth 2 (two) times a week.  10/07/12  Yes Historical Provider, MD    No Known Allergies  Physical Exam  Vitals  Blood pressure 94/61, pulse 114, temperature 98.9 F (37.2 C), temperature source Oral, resp. rate 19, last menstrual period 10/07/2014, SpO2 92 %.   General:  In mild acute distress shortness of breath and nausea  Psych:  Normal affect but does appear somewhat depressed and frustrated over her acute illness and verbalizes the same, Denies Suicidal or Homicidal ideations, Awake Alert, Oriented X 3. Speech and thought patterns are clear and appropriate, no apparent short term memory deficits  Neuro:   No focal neurological deficits, CN II through XII intact, Strength 5/5 all 4 extremities, Sensation intact all 4 extremities.  ENT:  Ears and Eyes appear Normal, Conjunctivae clear, PERL. Dry oral mucosa without erythema or exudates.  Neck:  Supple, No lymphadenopathy appreciated  Respiratory:  Symmetrical chest wall movement, Good air movement bilaterally, coarse to auscultation bilaterally with scattered expiratory rhonchi, diminished in the bases. 2 L  Cardiac:  RRR, No Murmurs, no LE edema noted, no JVD, No carotid bruits, peripheral pulses palpable at 2+  Abdomen:  Positive bowel sounds, Soft, Non tender, Non distended,  No masses appreciated, no obvious hepatosplenomegaly  Skin:  No Cyanosis, poor Skin Turgor, No Skin Rash or Bruise.  Extremities: Symmetrical without obvious trauma or injury,  no effusions.  Data Review  CBC  Recent Labs Lab 10/14/14 1648 10/14/2014 1120  WBC 4.7 6.0  HGB 10.9* 14.2  HCT 33.7* 44.4  PLT 232 251  MCV 84.3 86.0  MCH 27.3 27.5  MCHC 32.3 32.0  RDW 13.8 14.5  LYMPHSABS  --  1.0  MONOABS  --  0.4  EOSABS  --  0.0  BASOSABS  --  0.0    Chemistries   Recent  Labs Lab 10/14/14 1648 10/28/2014 1120  NA 135 136  K 3.1* 3.2*  CL 105 103  CO2 23 24  GLUCOSE 99 141*  BUN 8 6  CREATININE 0.83 0.78  CALCIUM 7.9* 7.7*  AST  --  34  ALT  --  23  ALKPHOS  --  39  BILITOT  --  0.4    estimated creatinine clearance is 105.5 mL/min (by C-G formula based on Cr of 0.78).  No results for input(s): TSH, T4TOTAL, T3FREE, THYROIDAB in the last  72 hours.  Invalid input(s): FREET3  Coagulation profile No results for input(s): INR, PROTIME in the last 168 hours.  No results for input(s): DDIMER in the last 72 hours.  Cardiac Enzymes No results for input(s): CKMB, TROPONINI, MYOGLOBIN in the last 168 hours.  Invalid input(s): CK  Invalid input(s): POCBNP  Urinalysis    Component Value Date/Time   COLORURINE YELLOW 10/14/2014 [place] 10/14/2014 1607   LABSPEC 1.020 10/14/2014 1607   PHURINE 5.5 10/14/2014 1607   GLUCOSEU NEGATIVE 10/14/2014 1607   HGBUR NEGATIVE 10/14/2014 1607   BILIRUBINUR NEGATIVE 10/14/2014 1607   KETONESUR NEGATIVE 10/14/2014 1607   PROTEINUR NEGATIVE 10/14/2014 1607   UROBILINOGEN 0.2 10/14/2014 1607   NITRITE NEGATIVE 10/14/2014 1607   LEUKOCYTESUR NEGATIVE 10/14/2014 1607    Imaging results:   Dg Chest 2 View  11/01/2014   CLINICAL DATA:  Upper respiratory infection subsequent evaluation, not improving with antibiotics, now with nausea vomiting and diarrhea  EXAM: CHEST  2 VIEW  COMPARISON:  10/14/2014  FINDINGS: Heart size and vascular pattern normal. Bilateral infiltrate in the midlung zones. No pleural effusion or evidence of pulmonary edema. Bony thorax intact.  IMPRESSION: New bilateral mid lung zone infiltrates concerning for bilateral pneumonia.   Electronically Signed   By: Skipper Cliche M.D.   On: 10/18/2014 14:14   Dg Chest 2 View  10/14/2014   CLINICAL DATA:  Chest pain with body aches and chills.  EXAM: CHEST  2 VIEW  COMPARISON:  11/08/2012.  FINDINGS: Normal cardiomediastinal  silhouette. Clear lung fields. No bony abnormality. Mild chronic basilar scarring. Stable appearance from priors.  IMPRESSION: No active cardiopulmonary disease.   Electronically Signed   By: Rolla Flatten M.D.   On: 10/14/2014 15:22     Assessment & Plan  Active Problems:   Sepsis -Admit to stepdown -Continues to have issues recurrent hypotension with systolic blood pressures dropping as low as 70-80 range -As of 4:57 PM she is had 2-1/2 L of fluid so we'll give another liter of fluid and if blood pressure continues to drop we'll need to ask for critical care medicine to evaluate the patient for possible ICU admission -Lactic acid has suddenly increased since admission from 2.66 to 2.9    Acute respiratory failure with hypoxia due to Bilateral community acquired pneumonia -Continue Rocephin and Zithromax -Obtain sputum culture -Blood cultures obtained in ER -Continue supportive care including oxygen    Nausea & vomiting -Discussed with patient that likely needs to be nothing by mouth at least for the next 8-12 hours but will order clear liquids to be available once patient has no further nausea -Provide Zofran and Phenergan for symptom management    Fever -Likely related to pneumonia but since she did not take influenza vaccine will check influenza PCR -Symptoms have been ongoing greater than 3 days so no role for Tamiflu at this juncture if she should return influenza positive -Continue supportive care with antipyretics    Dehydration/ Metabolic acidosis -Begin IV fluids at 150 mL per hour; blood pressure soft -Repeat lab in a.m.    HYPERCHOLESTEROLEMIA -Continue home medications once nausea and vomiting resolved    Obesity, Class III, BMI 40-49.9     DVT Prophylaxis: Lovenox  Family Communication:  Husband at bedside   Code Status: Full code   Condition:  Stable  Time spent in minutes : 60   Anayely Constantine L. ANP on 11/02/2014 at 4:26 PM  Between 7am to 7pm - Pager  -  [phone]  After 7pm go to [email or web address] - password [email or web address]  And look for the night coverage person covering me after hours  [facility]

## 2014-10-17 NOTE — ED Notes (Signed)
Admitting MD at the bedside.  

## 2014-10-17 NOTE — ED Notes (Signed)
She was here Wednesday and dx with URI. She reports she was sent home with pain meds but does not feel any better and now has n/v/d.

## 2014-10-17 NOTE — ED Notes (Signed)
NS Liter started per Admitting MD.

## 2014-10-17 NOTE — Progress Notes (Signed)
[place] for Vancomycin and Cefepime Indication: rule out sepsis/ PNA  No Known Allergies  Patient Measurements:  Ht: 5'7" Actual wt: 99.8kg Adjusted Body Weight: 78kg  Vital Signs: Temp: 97.3 F (36.3 C) (03/05 1644) Temp Source: Oral (03/05 1644) BP: 98/64 mmHg (03/05 1936) Pulse Rate: 112 (03/05 1650) Intake/Output from previous day:   Intake/Output from this shift:    Labs:  Recent Labs  10/18/2014 1120  WBC 6.0  HGB 14.2  PLT 251  CREATININE 0.78   Estimated Creatinine Clearance: 105.5 mL/min (by C-G formula based on Cr of 0.78). No results for input(s): VANCOTROUGH, VANCOPEAK, VANCORANDOM, GENTTROUGH, GENTPEAK, GENTRANDOM, TOBRATROUGH, TOBRAPEAK, TOBRARND, AMIKACINPEAK, AMIKACINTROU, AMIKACIN in the last 72 hours.   Microbiology: Recent Results (from the past 720 hour(s))  Blood culture (routine x 2)     Status: None (Preliminary result)   Collection Time: 10/14/14  4:05 PM  Result Value Ref Range Status   Specimen Description BLOOD HAND RIGHT  Final   Special Requests BOTTLES DRAWN AEROBIC AND ANAEROBIC [place]  Final   Culture   Final           BLOOD CULTURE RECEIVED NO GROWTH TO DATE CULTURE WILL BE HELD FOR 5 DAYS BEFORE ISSUING A FINAL NEGATIVE REPORT Performed at [facility]    Report Status PENDING  Incomplete  Blood culture (routine x 2)     Status: None (Preliminary result)   Collection Time: 10/14/14  4:16 PM  Result Value Ref Range Status   Specimen Description BLOOD ARM LEFT  Final   Special Requests BOTTLES DRAWN AEROBIC AND ANAEROBIC 5CC  Final   Culture   Final           BLOOD CULTURE RECEIVED NO GROWTH TO DATE CULTURE WILL BE HELD FOR 5 DAYS BEFORE ISSUING A FINAL NEGATIVE REPORT Performed at [facility]    Report Status PENDING  Incomplete    Medical History: Past Medical History  Diagnosis Date  . Hyperlipidemia   . Vitamin D deficiency disease   . Morbid obesity      Medications:  Pravastain and vitamin D  Assessment: 49 yo obese female presented with hypotension,  nausea, vomiting and diarrhea, worsening chest pain, cough and shortness of breath. Pt with hypoxic respiratory failure secondary to CAP. Increased lactic acid. CXRAY concerning for bilateral PNA. WBC nl. Pending  blood and urine cultures.  Goal of Therapy:  Vancomycin trough level 15-20 mcg/ml  Plan:  Expected duration 7 days with resolution of temperature and/or normalization of WBC Vancomyin 1500mg  load , then 1gm IV Q8h Cefepime 2gm IV q8H Monitor CBC, Scr  Levels prn as indicated   Isac Sarna, BS Pharm D, BCPS Clinical Pharmacist 10/21/2014,7:45 PM

## 2014-10-17 NOTE — ED Notes (Signed)
I Stat Lactic Acid results shown to Dr. Keturah Barre. Jeanell Sparrow

## 2014-10-17 NOTE — Progress Notes (Signed)
Given persistent hypotension and suspicion of underlying sepsis we'll begin stress dose steroids with Solu-Cortef. Cortisol has ordered and obtained.  Erin Hearing ANP

## 2014-10-17 NOTE — H&P (Signed)
PULMONARY / CRITICAL CARE MEDICINE HISTORY AND PHYSICAL EXAMINATION   Name: [name] MRN: [id number] DOB: 03-24-1966    ADMISSION DATE:  10/15/2014  PRIMARY SERVICE: PCCM  CHIEF COMPLAINT:  Fatigue  BRIEF PATIENT DESCRIPTION: 6 F with flu-like illness and sudden decompensation concerning for flu --> MRSA. Initially admitted to [facility] but PCCM took over when unable to maintain BPs.  SIGNIFICANT EVENTS / STUDIES:  Lactate 2.66 --> 2.91 --> 2.94 --> 5.88  LINES / TUBES: R subclavian CVC 3/5-  CULTURES: Blood culture x2, 3/5 Urine culture, 3/5 Sputum culture, 3/5 Urine Legionella and Pneumooccal Antigens, 3/5- RVP, 3/5  ANTIBIOTICS: Vanc, 3/5 -   Cefepime, 3/5 - Azithro, 3/5 -  Tamiflu, 3/5 -  HISTORY OF PRESENT ILLNESS:  [name] is a 59 F with HL who presents to [facility] with a one week history of worsening fatigue and dyspnea. She sought medical attention on 3/2 and at that time has a clear chest X-ray and given IVF and discharged. She continued to feel worse throughout the week and represented tonight. She notes headaches, ST, fevers, chills, N/V, D, productive cough, and pleuritic chest pain.   PAST MEDICAL HISTORY :  Past Medical History  Diagnosis Date  . Hyperlipidemia   . Vitamin D deficiency disease   . Morbid obesity    Past Surgical History  Procedure Laterality Date  . Breast surgery  1993    reduction  . Tubal ligation  2003  . Foot surgery  2008     rt foot tarpals   . Knee surgery  2012    rt knee   . Laparoscopic cholecystectomy     Prior to Admission medications   Medication Sig Start Date End Date Taking? Authorizing Provider  AMBULATORY NON FORMULARY MEDICATION Take 1 tablet by mouth 2 (two) times daily. Medication Name: Prev Pac 11/12/12  Yes Gayland Curry, MD  HYDROcodone-acetaminophen (NORCO/VICODIN) 5-325 MG per tablet Take 1 tablet by mouth every 6 (six) hours as needed for moderate pain. 10/14/14  Yes Evelina Bucy, MD  pravastatin (PRAVACHOL)  40 MG tablet Take 40 mg by mouth daily.  09/27/12  Yes Historical Provider, MD  Vitamin D, Ergocalciferol, (DRISDOL) 50000 UNITS CAPS Take 50,000 Units by mouth 2 (two) times a week.  10/07/12  Yes Historical Provider, MD   No Known Allergies  FAMILY HISTORY:  Family History  Problem Relation Age of Onset  . Cancer Mother     ovarian ca  . Diabetes Father    SOCIAL HISTORY:  reports that she quit smoking about 3 months ago. Her smoking use included Cigarettes. She smoked 0.50 packs per day. She does not have any smokeless tobacco history on file. She reports that she drinks alcohol. Her drug history is not on file.  REVIEW OF SYSTEMS:  A 12-system ROS was conducted and, unless otherwise specified in the HPI, was negative.   SUBJECTIVE:   VITAL SIGNS: Temp:  [97.3 F (36.3 C)-98.9 F (37.2 C)] 97.3 F (36.3 C) (03/05 1644) Pulse Rate:  [110-123] 112 (03/05 1650) Resp:  [14-28] 28 (03/05 1815) BP: (68-119)/(42-83) 68/44 mmHg (03/05 1900) SpO2:  [88 %-98 %] 97 % (03/05 1650) HEMODYNAMICS:   VENTILATOR SETTINGS:   INTAKE / OUTPUT: Intake/Output    None     PHYSICAL EXAMINATION: General:  Obese F in mild distress Neuro:  Intact HEENT:  Sclera anicteric, conjunctiva pink, MMM, OP Clear Neck: Trachea supple and midline, (-) LAN or JVD Cardiovascular:  RRR, NS1/S2, (-) MRG  Lungs:  Coarse Rales diffusely Abdomen:  S/NT/ND/(+)BS Musculoskeletal:  (-) C/C/E Skin:  Intact  LABS:  CBC  Recent Labs Lab 10/14/14 1648 11/03/2014 1120  WBC 4.7 6.0  HGB 10.9* 14.2  HCT 33.7* 44.4  PLT 232 251   Coag's No results for input(s): APTT, INR in the last 168 hours. BMET  Recent Labs Lab 10/14/14 1648 11/06/2014 1120  NA 135 136  K 3.1* 3.2*  CL 105 103  CO2 23 24  BUN 8 6  CREATININE 0.83 0.78  GLUCOSE 99 141*   Electrolytes  Recent Labs Lab 10/14/14 1648 10/31/2014 1120  CALCIUM 7.9* 7.7*   Sepsis Markers  Recent Labs Lab 11/08/2014 1508 11/02/2014 1605  11/02/2014 1829  LATICACIDVEN 2.91* 2.94* 5.88*   ABG No results for input(s): PHART, PCO2ART, PO2ART in the last 168 hours. Liver Enzymes  Recent Labs Lab 11/04/2014 1120  AST 34  ALT 23  ALKPHOS 39  BILITOT 0.4  ALBUMIN 3.1*   Cardiac Enzymes No results for input(s): TROPONINI, PROBNP in the last 168 hours. Glucose No results for input(s): GLUCAP in the last 168 hours.  Imaging Dg Chest 2 View  10/14/2014   CLINICAL DATA:  Upper respiratory infection subsequent evaluation, not improving with antibiotics, now with nausea vomiting and diarrhea  EXAM: CHEST  2 VIEW  COMPARISON:  10/14/2014  FINDINGS: Heart size and vascular pattern normal. Bilateral infiltrate in the midlung zones. No pleural effusion or evidence of pulmonary edema. Bony thorax intact.  IMPRESSION: New bilateral mid lung zone infiltrates concerning for bilateral pneumonia.   Electronically Signed   By: Skipper Cliche M.D.   On: 10/13/2014 14:14    EKG: None obtained yet CXR: Reviewed, new diffuse opacities  ASSESSMENT / PLAN:  Active Problems:   HYPERCHOLESTEROLEMIA   Obesity, Class III, BMI 40-49.9 (morbid obesity)   Acute respiratory failure with hypoxia   Bilateral pneumonia   CAP (community acquired pneumonia)   Nausea & vomiting   Fever   Dehydration   Metabolic acidosis   Sepsis   PULMONARY A: Acute hypoxcic respiratory failure: Likely 2/2 pneumonia +/- ARDS.  Currently maintaining sats with [place]. P:   Supplemental O2 to maintain sats >= 92%  CARDIOVASCULAR A: Septic Shock: Almost certainly 2/2 pneumonia. S/p 4L IVF in ED. HL: P:   EKG Serial Trops/Lactates Stress dose steroids Check Cortisol NE to maintain MAP >=65  RENAL A: Hypokalemia:  Hypocalcemia: P:   Replete  GASTROINTESTINAL A: No acute issues  HEMATOLOGIC A: No acute issues  INFECTIOUS A: Pneumonia with Concern for Flu:  P:   Tamiflu Empiric Vanc/Cefepime/Azithro Follow-up cultures and deescalate in 48 hrs as  appropriate  ENDOCRINE A: Hyperglycemia:  Vit D Def P:   SSI A1c  NEUROLOGIC A: No acute issue  BEST PRACTICE / DISPOSITION Level of Care:  ICU Primary Service:  PCCM Consultants:  None Code Status:  Full Diet:  Reg DVT Px:  Lovenox GI Px:  Not indicated Skin Integrity:  Intact Social / Family:  Husband updated at bedside  TODAY'S SUMMARY:   I have personally obtained a history, examined the patient, evaluated laboratory and imaging results, formulated the assessment and plan and placed orders.  CRITICAL CARE: The patient is critically ill with multiple organ systems failure and requires high complexity decision making for assessment and support, frequent evaluation and titration of therapies, application of advanced monitoring technologies and extensive interpretation of multiple databases. Critical Care Time devoted to patient care services described in this note  is 60 minutes.   Margarette Asal, MD Pulmonary and [place] Pager: [phone]   11/01/2014, 7:27 PM

## 2014-10-17 NOTE — ED Notes (Signed)
Lenna Sciara, Charge RN & Lurena Nida, NP informed that step down cannot accept pt d/t decreasing BP post NS bolus admin, awaiting critical care to see the pt, pt & family aware of plan of care

## 2014-10-17 NOTE — Procedures (Signed)
Central Venous Catheter Insertion Procedure Note [name] [id number] 29-Mar-1966  Procedure: Insertion of Central Venous Catheter Indications: Assessment of intravascular volume, Drug and/or fluid administration and Frequent blood sampling  Procedure Details Consent: Risks of procedure as well as the alternatives and risks of each were explained to the (patient/caregiver).  Consent for procedure obtained. Time Out: Verified patient identification, verified procedure, site/side was marked, verified correct patient position, special equipment/implants available, medications/allergies/relevent history reviewed, required imaging and test results available.  Performed  Maximum sterile technique was used including antiseptics, cap, gloves, gown, hand hygiene, mask and sheet. Skin prep: Chlorhexidine; local anesthetic administered A antimicrobial bonded/coated triple lumen catheter was placed in the right subclavian vein using the Seldinger technique.  Evaluation Blood flow good Complications: No apparent complications Patient did tolerate procedure well. Chest X-ray ordered to verify placement.  CXR: pending.  [name] R. 11/11/2014, 8:09 PM

## 2014-10-17 NOTE — ED Notes (Signed)
I Stat Lactic Acid results shown to Dr. Ray. 

## 2014-10-17 NOTE — ED Notes (Signed)
Called flow manager to inform bed is being change to step down.

## 2014-10-17 NOTE — Progress Notes (Signed)
Notified by nursing staff that lactic acid has now increased to 5.88. I also spoke with EDP Dr. Betsey Holiday who agreed to put in a central line so we could initiate pressors until the patient could be evaluated by PCCM/intensivist service. He also spoke with Dr. Stevenson Clinch and updated him on the new lactic acid level and current vital signs.PCCM has agreed to take the patient on their service and they will  evaluate the patient and place any needed lines and begin any needed pressor therapy if indicated. Order placed in computer to admit patient to ICU. I will update the flow manager.  Erin Hearing, ANP

## 2014-10-18 ENCOUNTER — Inpatient Hospital Stay (HOSPITAL_COMMUNITY): Payer: Medicaid Other

## 2014-10-18 DIAGNOSIS — E872 Acidosis: Secondary | ICD-10-CM

## 2014-10-18 DIAGNOSIS — R6521 Severe sepsis with septic shock: Secondary | ICD-10-CM

## 2014-10-18 LAB — BASIC METABOLIC PANEL
Anion gap: 8 (ref 5–15)
Anion gap: 12 (ref 5–15)
BUN: 14 mg/dL (ref 6–23)
BUN: 17 mg/dL (ref 6–23)
CALCIUM: 7.1 mg/dL — AB (ref 8.4–10.5)
CHLORIDE: 110 mmol/L (ref 96–112)
CO2: 19 mmol/L (ref 19–32)
CO2: 17 mmol/L — ABNORMAL LOW (ref 19–32)
Calcium: 6.5 mg/dL — ABNORMAL LOW (ref 8.4–10.5)
Chloride: 108 mmol/L (ref 96–112)
Creatinine, Ser: 2.38 mg/dL — ABNORMAL HIGH (ref 0.50–1.10)
Creatinine, Ser: 2.65 mg/dL — ABNORMAL HIGH (ref 0.50–1.10)
GFR calc Af Amer: 27 mL/min — ABNORMAL LOW (ref >90)
GFR calc Af Amer: 24 mL/min — ABNORMAL LOW (ref >90)
GFR calc non Af Amer: 20 mL/min — ABNORMAL LOW (ref >90)
GFR, EST NON AFRICAN AMERICAN: 23 mL/min — AB (ref >90)
GLUCOSE: 228 mg/dL — AB (ref 70–99)
Glucose, Bld: 253 mg/dL — ABNORMAL HIGH (ref 70–99)
POTASSIUM: 3.9 mmol/L (ref 3.5–5.1)
Potassium: 4.3 mmol/L (ref 3.5–5.1)
SODIUM: 137 mmol/L (ref 135–145)
SODIUM: 137 mmol/L (ref 135–145)

## 2014-10-18 LAB — POCT I-STAT 3, VENOUS BLOOD GAS (G3P V)
ACID-BASE DEFICIT: 13 mmol/L — AB (ref 0.0–2.0)
Acid-base deficit: 14 mmol/L — ABNORMAL HIGH (ref 0.0–2.0)
Bicarbonate: 15.8 mEq/L — ABNORMAL LOW (ref 20.0–24.0)
Bicarbonate: 14.8 mEq/L — ABNORMAL LOW (ref 20.0–24.0)
O2 SAT: 35 %
O2 Saturation: 32 %
PCO2 VEN: 43.1 mmHg — AB (ref 45.0–50.0)
PH VEN: 7.139 — AB (ref 7.250–7.300)
PO2 VEN: 27 mmHg — AB (ref 30.0–45.0)
PO2 VEN: 25 mmHg — AB (ref 30.0–45.0)
Patient temperature: 98.8
TCO2: 17 mmol/L (ref 0–100)
TCO2: 16 mmol/L (ref 0–100)
pCO2, Ven: 45.1 mmHg (ref 45.0–50.0)
pH, Ven: 7.155 — CL (ref 7.250–7.300)

## 2014-10-18 LAB — CBC
HCT: 45.2 % (ref 36.0–46.0)
HCT: 44.6 % (ref 36.0–46.0)
Hemoglobin: 14.5 g/dL (ref 12.0–15.0)
Hemoglobin: 14.7 g/dL (ref 12.0–15.0)
MCH: 27.6 pg (ref 26.0–34.0)
MCH: 27.5 pg (ref 26.0–34.0)
MCHC: 32.1 g/dL (ref 30.0–36.0)
MCHC: 33 g/dL (ref 30.0–36.0)
MCV: 86.1 fL (ref 78.0–100.0)
MCV: 83.4 fL (ref 78.0–100.0)
Platelets: 201 10*3/uL (ref 150–400)
Platelets: 194 10*3/uL (ref 150–400)
RBC: 5.25 MIL/uL — ABNORMAL HIGH (ref 3.87–5.11)
RBC: 5.35 MIL/uL — AB (ref 3.87–5.11)
RDW: 14.7 % (ref 11.5–15.5)
RDW: 14.4 % (ref 11.5–15.5)
WBC: 10.4 10*3/uL (ref 4.0–10.5)
WBC: 14.6 10*3/uL — ABNORMAL HIGH (ref 4.0–10.5)

## 2014-10-18 LAB — LACTIC ACID, PLASMA
LACTIC ACID, VENOUS: 8.1 mmol/L — AB (ref 0.5–2.0)
LACTIC ACID, VENOUS: 5.7 mmol/L — AB (ref 0.5–2.0)
LACTIC ACID, VENOUS: 4.8 mmol/L — AB (ref 0.5–2.0)
Lactic Acid, Venous: 4.7 mmol/L (ref 0.5–2.0)

## 2014-10-18 LAB — INFLUENZA PANEL BY PCR (TYPE A & B)
H1N1FLUPCR: NOT DETECTED
INFLAPCR: NEGATIVE
Influenza B By PCR: NEGATIVE

## 2014-10-18 LAB — POCT I-STAT 3, ART BLOOD GAS (G3+)
ACID-BASE DEFICIT: 11 mmol/L — AB (ref 0.0–2.0)
ACID-BASE DEFICIT: 10 mmol/L — AB (ref 0.0–2.0)
BICARBONATE: 14.1 meq/L — AB (ref 20.0–24.0)
Bicarbonate: 15.1 mEq/L — ABNORMAL LOW (ref 20.0–24.0)
O2 SAT: 94 %
O2 Saturation: 93 %
PCO2 ART: 34.1 mmHg — AB (ref 35.0–45.0)
PH ART: 7.34 — AB (ref 7.350–7.450)
Patient temperature: 97.8
Patient temperature: 98.7
TCO2: 16 mmol/L (ref 0–100)
TCO2: 15 mmol/L (ref 0–100)
pCO2 arterial: 26.1 mmHg — ABNORMAL LOW (ref 35.0–45.0)
pH, Arterial: 7.251 — ABNORMAL LOW (ref 7.350–7.450)
pO2, Arterial: 78 mmHg — ABNORMAL LOW (ref 80.0–100.0)
pO2, Arterial: 71 mmHg — ABNORMAL LOW (ref 80.0–100.0)

## 2014-10-18 LAB — BRAIN NATRIURETIC PEPTIDE: B Natriuretic Peptide: 279.9 pg/mL — ABNORMAL HIGH (ref 0.0–100.0)

## 2014-10-18 LAB — BLOOD GAS, ARTERIAL
ACID-BASE DEFICIT: 17.1 mmol/L — AB (ref 0.0–2.0)
BICARBONATE: 9.5 meq/L — AB (ref 20.0–24.0)
O2 Content: 6 L/min
O2 SAT: 79 %
PO2 ART: 57.4 mmHg — AB (ref 80.0–100.0)
Patient temperature: 98.6
TCO2: 10.2 mmol/L (ref 0–100)
pCO2 arterial: 25.5 mmHg — ABNORMAL LOW (ref 35.0–45.0)
pH, Arterial: 7.194 — CL (ref 7.350–7.450)

## 2014-10-18 LAB — GLUCOSE, CAPILLARY
GLUCOSE-CAPILLARY: 164 mg/dL — AB (ref 70–99)
GLUCOSE-CAPILLARY: 165 mg/dL — AB (ref 70–99)
Glucose-Capillary: 213 mg/dL — ABNORMAL HIGH (ref 70–99)
Glucose-Capillary: 224 mg/dL — ABNORMAL HIGH (ref 70–99)
Glucose-Capillary: 233 mg/dL — ABNORMAL HIGH (ref 70–99)

## 2014-10-18 LAB — TROPONIN I
TROPONIN I: 0.34 ng/mL — AB (ref <0.031)
TROPONIN I: 0.9 ng/mL — AB (ref <0.031)

## 2014-10-18 MED ORDER — CETYLPYRIDINIUM CHLORIDE 0.05 % MT LIQD
7.0000 mL | Freq: Four times a day (QID) | OROMUCOSAL | Status: DC
  Administered 2014-10-18 – 2014-10-19 (×4): 7 mL via OROMUCOSAL

## 2014-10-18 MED ORDER — FENTANYL BOLUS VIA INFUSION
50.0000 ug | INTRAVENOUS | Status: DC | PRN
  Filled 2014-10-18: qty 50

## 2014-10-18 MED ORDER — FENTANYL CITRATE 0.05 MG/ML IJ SOLN: 100.0000 ug | Freq: Once | INTRAMUSCULAR | Status: AC | PRN

## 2014-10-18 MED ORDER — OSELTAMIVIR PHOSPHATE 6 MG/ML PO SUSR
30.0000 mg | Freq: Two times a day (BID) | ORAL | Status: DC
  Administered 2014-10-18: 30 mg
  Filled 2014-10-18 (×3): qty 5

## 2014-10-18 MED ORDER — DOCUSATE SODIUM 100 MG PO CAPS
100.0000 mg | ORAL_CAPSULE | Freq: Two times a day (BID) | ORAL | Status: DC | PRN
  Filled 2014-10-18: qty 1

## 2014-10-18 MED ORDER — MIDAZOLAM HCL 2 MG/2ML IJ SOLN
INTRAMUSCULAR | Status: AC
  Filled 2014-10-18: qty 4

## 2014-10-18 MED ORDER — PROPOFOL 10 MG/ML IV BOLUS
INTRAVENOUS | Status: AC
  Filled 2014-10-18: qty 20

## 2014-10-18 MED ORDER — DEXTROSE 5 % IV SOLN
2.0000 g | INTRAVENOUS | Status: DC
  Administered 2014-10-19: 2 g via INTRAVENOUS
  Filled 2014-10-18: qty 2

## 2014-10-18 MED ORDER — CISATRACURIUM BOLUS VIA INFUSION
0.0500 mg/kg | Freq: Once | INTRAVENOUS | Status: AC
  Administered 2014-10-18: 5.8 mg via INTRAVENOUS
  Filled 2014-10-18: qty 6

## 2014-10-18 MED ORDER — IPRATROPIUM BROMIDE 0.02 % IN SOLN: 0.5000 mg | RESPIRATORY_TRACT | Status: DC

## 2014-10-18 MED ORDER — MIDAZOLAM HCL 2 MG/2ML IJ SOLN
2.0000 mg | INTRAMUSCULAR | Status: DC | PRN
  Administered 2014-10-18: 2 mg via INTRAVENOUS
  Filled 2014-10-18 (×2): qty 2

## 2014-10-18 MED ORDER — ALBUTEROL SULFATE (2.5 MG/3ML) 0.083% IN NEBU: 2.5000 mg | INHALATION_SOLUTION | RESPIRATORY_TRACT | Status: DC

## 2014-10-18 MED ORDER — PHENYLEPHRINE HCL 10 MG/ML IJ SOLN
0.0000 ug/min | INTRAVENOUS | Status: DC
  Administered 2014-10-18: 100 ug/min via INTRAVENOUS
  Filled 2014-10-18: qty 1

## 2014-10-18 MED ORDER — SODIUM BICARBONATE 8.4 % IV SOLN
INTRAVENOUS | Status: DC
  Administered 2014-10-18 (×3): via INTRAVENOUS
  Filled 2014-10-18 (×7): qty 150

## 2014-10-18 MED ORDER — FENTANYL CITRATE 0.05 MG/ML IJ SOLN
50.0000 ug | Freq: Once | INTRAMUSCULAR | Status: AC
  Administered 2014-10-18: 50 ug via INTRAVENOUS

## 2014-10-18 MED ORDER — SODIUM CHLORIDE 0.9 % IV SOLN
25.0000 ug/h | INTRAVENOUS | Status: DC
  Administered 2014-10-18: 100 ug/h via INTRAVENOUS
  Filled 2014-10-18: qty 50

## 2014-10-18 MED ORDER — MIDAZOLAM BOLUS VIA INFUSION
2.0000 mg | INTRAVENOUS | Status: DC | PRN
  Filled 2014-10-18: qty 2

## 2014-10-18 MED ORDER — SODIUM CHLORIDE 0.9 % IV SOLN
25.0000 ug/h | INTRAVENOUS | Status: DC
  Filled 2014-10-18: qty 50

## 2014-10-18 MED ORDER — MIDAZOLAM HCL 2 MG/2ML IJ SOLN
2.0000 mg | Freq: Once | INTRAMUSCULAR | Status: AC
  Filled 2014-10-18: qty 2

## 2014-10-18 MED ORDER — MIDAZOLAM HCL 2 MG/2ML IJ SOLN
2.0000 mg | Freq: Once | INTRAMUSCULAR | Status: AC | PRN
  Administered 2014-10-18: 2 mg via INTRAVENOUS

## 2014-10-18 MED ORDER — ALBUTEROL SULFATE (2.5 MG/3ML) 0.083% IN NEBU: 2.5000 mg | INHALATION_SOLUTION | RESPIRATORY_TRACT | Status: DC | PRN

## 2014-10-18 MED ORDER — SODIUM CHLORIDE 0.9 % IV SOLN
3.0000 ug/kg/min | INTRAVENOUS | Status: DC
  Administered 2014-10-18 – 2014-10-19 (×2): 3 ug/kg/min via INTRAVENOUS
  Filled 2014-10-18 (×2): qty 20

## 2014-10-18 MED ORDER — CHLORHEXIDINE GLUCONATE 0.12 % MT SOLN
15.0000 mL | Freq: Two times a day (BID) | OROMUCOSAL | Status: DC
  Administered 2014-10-18: 15 mL via OROMUCOSAL
  Filled 2014-10-18: qty 15

## 2014-10-18 MED ORDER — SODIUM CHLORIDE 0.9 % IV SOLN
1.0000 mg/h | INTRAVENOUS | Status: DC
  Administered 2014-10-18: 2 mg/h via INTRAVENOUS
  Administered 2014-10-19: 3 mg/h via INTRAVENOUS
  Filled 2014-10-18 (×2): qty 10

## 2014-10-18 MED ORDER — INFLUENZA VAC SPLIT QUAD 0.5 ML IM SUSY
0.5000 mL | PREFILLED_SYRINGE | INTRAMUSCULAR | Status: DC
  Filled 2014-10-18: qty 0.5

## 2014-10-18 MED ORDER — ARTIFICIAL TEARS OP OINT
1.0000 "application " | TOPICAL_OINTMENT | Freq: Three times a day (TID) | OPHTHALMIC | Status: DC
  Administered 2014-10-18 – 2014-10-19 (×3): 1 via OPHTHALMIC
  Filled 2014-10-18: qty 3.5

## 2014-10-18 MED ORDER — IPRATROPIUM-ALBUTEROL 0.5-2.5 (3) MG/3ML IN SOLN
3.0000 mL | RESPIRATORY_TRACT | Status: DC
  Administered 2014-10-18 (×5): 3 mL via RESPIRATORY_TRACT
  Filled 2014-10-18 (×4): qty 3

## 2014-10-18 MED ORDER — PANTOPRAZOLE SODIUM 40 MG IV SOLR
40.0000 mg | Freq: Every day | INTRAVENOUS | Status: DC
Start: 2014-10-18 — End: 2014-10-19
  Administered 2014-10-18: 40 mg via INTRAVENOUS
  Filled 2014-10-18 (×2): qty 40

## 2014-10-18 MED ORDER — FENTANYL CITRATE 0.05 MG/ML IJ SOLN
INTRAMUSCULAR | Status: AC
  Filled 2014-10-18: qty 2

## 2014-10-18 MED ORDER — FENTANYL CITRATE 0.05 MG/ML IJ SOLN
100.0000 ug | Freq: Once | INTRAMUSCULAR | Status: AC
  Administered 2014-10-18: 100 ug via INTRAVENOUS

## 2014-10-18 MED ORDER — MIDAZOLAM HCL 2 MG/2ML IJ SOLN
INTRAMUSCULAR | Status: AC
  Filled 2014-10-18: qty 2

## 2014-10-18 MED ORDER — PNEUMOCOCCAL VAC POLYVALENT 25 MCG/0.5ML IJ INJ
0.5000 mL | INJECTION | INTRAMUSCULAR | Status: DC
  Filled 2014-10-18: qty 0.5

## 2014-10-18 MED ORDER — PHENYLEPHRINE HCL 10 MG/ML IJ SOLN
0.0000 ug/min | INTRAVENOUS | Status: DC
  Administered 2014-10-19: 125 ug/min via INTRAVENOUS
  Administered 2014-10-19: 300 ug/min via INTRAVENOUS
  Filled 2014-10-18 (×3): qty 4

## 2014-10-18 MED ORDER — MIDAZOLAM HCL 2 MG/2ML IJ SOLN
2.0000 mg | INTRAMUSCULAR | Status: AC | PRN
  Administered 2014-10-18 (×3): 2 mg via INTRAVENOUS
  Filled 2014-10-18 (×2): qty 2

## 2014-10-18 MED ORDER — SODIUM CHLORIDE 0.9 % IV BOLUS (SEPSIS): 2000.0000 mL | Freq: Once | INTRAVENOUS | Status: DC

## 2014-10-18 MED ORDER — PROPOFOL 10 MG/ML IV EMUL
INTRAVENOUS | Status: AC
  Filled 2014-10-18: qty 100

## 2014-10-18 MED ORDER — HEPARIN SODIUM (PORCINE) 5000 UNIT/ML IJ SOLN
5000.0000 [IU] | Freq: Three times a day (TID) | INTRAMUSCULAR | Status: DC
  Administered 2014-10-18 – 2014-10-19 (×3): 5000 [IU] via SUBCUTANEOUS
  Filled 2014-10-18 (×4): qty 1

## 2014-10-18 MED ORDER — LORAZEPAM 2 MG/ML IJ SOLN
1.0000 mg | Freq: Once | INTRAMUSCULAR | Status: AC
  Administered 2014-10-18: 1 mg via INTRAVENOUS
  Filled 2014-10-18: qty 1

## 2014-10-18 MED ORDER — SODIUM BICARBONATE 8.4 % IV SOLN
INTRAVENOUS | Status: AC
  Filled 2014-10-18: qty 100

## 2014-10-18 MED ORDER — OSELTAMIVIR PHOSPHATE 6 MG/ML PO SUSR
75.0000 mg | Freq: Two times a day (BID) | ORAL | Status: DC
  Administered 2014-10-18: 75 mg
  Filled 2014-10-18 (×2): qty 12.5

## 2014-10-18 MED ORDER — VASOPRESSIN 20 UNIT/ML IV SOLN
0.0300 [IU]/min | INTRAVENOUS | Status: DC
  Administered 2014-10-18: 0.03 [IU]/min via INTRAVENOUS
  Filled 2014-10-18 (×3): qty 2

## 2014-10-18 MED ORDER — SODIUM BICARBONATE 8.4 % IV SOLN
100.0000 meq | Freq: Once | INTRAVENOUS | Status: AC
  Administered 2014-10-18: 100 meq via INTRAVENOUS

## 2014-10-18 MED FILL — Medication: Qty: 1 | Status: AC

## 2014-10-18 NOTE — Progress Notes (Addendum)
ANTIBIOTIC CONSULT NOTE - FOLLOW UP  Pharmacy Consult for vancomycin, cefepime Indication: sepsis/PNA  No Known Allergies  Patient Measurements: Height: 5\' 7"  (170.2 cm) Weight: 256 lb 6.3 oz (116.3 kg) IBW/kg (Calculated) : 61.6  Vital Signs: Temp: 97.8 F (36.6 C) (03/06 0741) Temp Source: Oral (03/06 0741) BP: 110/85 mmHg (03/06 0815) Pulse Rate: 132 (03/06 1000) Intake/Output from previous day: 03/05 0701 - 03/06 0700 In: 2081.6 [I.V.:971.6; IV Piggyback:1110] Out: 85 [Urine:85] Intake/Output from this shift: Total I/O In: -  Out: 25 [Urine:25]  Labs:  Recent Labs  10/15/2014 1120 10/18/14 0326  WBC 6.0 10.4  HGB 14.2 14.5  PLT 251 201  CREATININE 0.78 2.38*   Estimated Creatinine Clearance: 38.5 mL/min (by C-G formula based on Cr of 2.38). No results for input(s): VANCOTROUGH, VANCOPEAK, VANCORANDOM, GENTTROUGH, GENTPEAK, GENTRANDOM, TOBRATROUGH, TOBRAPEAK, TOBRARND, AMIKACINPEAK, AMIKACINTROU, AMIKACIN in the last 72 hours.   Microbiology: Recent Results (from the past 720 hour(s))  Blood culture (routine x 2)     Status: None (Preliminary result)   Collection Time: 10/14/14  4:05 PM  Result Value Ref Range Status   Specimen Description BLOOD HAND RIGHT  Final   Special Requests BOTTLES DRAWN AEROBIC AND ANAEROBIC [place]  Final   Culture   Final           BLOOD CULTURE RECEIVED NO GROWTH TO DATE CULTURE WILL BE HELD FOR 5 DAYS BEFORE ISSUING A FINAL NEGATIVE REPORT Performed at [facility]    Report Status PENDING  Incomplete  Blood culture (routine x 2)     Status: None (Preliminary result)   Collection Time: 10/14/14  4:16 PM  Result Value Ref Range Status   Specimen Description BLOOD ARM LEFT  Final   Special Requests BOTTLES DRAWN AEROBIC AND ANAEROBIC 5CC  Final   Culture   Final           BLOOD CULTURE RECEIVED NO GROWTH TO DATE CULTURE WILL BE HELD FOR 5 DAYS BEFORE ISSUING A FINAL NEGATIVE REPORT Performed at [facility]    Report Status PENDING  Incomplete  MRSA PCR Screening     Status: None   Collection Time: 10/13/2014  8:34 PM  Result Value Ref Range Status   MRSA by PCR NEGATIVE NEGATIVE Final    Comment:        The GeneXpert MRSA Assay (FDA approved for NASAL specimens only), is one component of a comprehensive MRSA colonization surveillance program. It is not intended to diagnose MRSA infection nor to guide or monitor treatment for MRSA infections.     Anti-infectives    Start     Dose/Rate Route Frequency Ordered Stop   11/05/14 0608  ceFEPIme (MAXIPIME) 2 g in dextrose 5 % 50 mL IVPB     2 g 100 mL/hr over 30 Minutes Intravenous Every 24 hours 10/18/14 1131     10/18/14 2200  oseltamivir (TAMIFLU) 6 MG/ML suspension 30 mg     30 mg Per Tube 2 times daily 10/18/14 1131 10/22/14 2159   10/18/14 1100  oseltamivir (TAMIFLU) 6 MG/ML suspension 75 mg  Status:  Discontinued     75 mg Per Tube 2 times daily 10/18/14 1051 10/18/14 1131   10/18/14 0500  vancomycin (VANCOCIN) IVPB 1000 mg/200 mL premix  Status:  Discontinued     1,000 mg 200 mL/hr over 60 Minutes Intravenous Every 8 hours 10/14/2014 2004 10/18/14 1131   11/06/2014 2200  ceFEPIme (MAXIPIME) 2 g in dextrose 5 % 50 mL  IVPB  Status:  Discontinued     2 g 100 mL/hr over 30 Minutes Intravenous 3 times per day 11/08/2014 2004 10/18/14 1131   10/16/2014 2100  oseltamivir (TAMIFLU) capsule 75 mg  Status:  Discontinued     75 mg Oral 2 times daily 11/02/2014 1926 10/18/14 1051   11/09/2014 2100  vancomycin (VANCOCIN) 1,500 mg in sodium chloride 0.9 % 500 mL IVPB     1,500 mg 250 mL/hr over 120 Minutes Intravenous  Once 11/05/2014 2004 10/18/14 0041   11/01/2014 1930  cefTRIAXone (ROCEPHIN) 1 g in dextrose 5 % 50 mL IVPB  Status:  Discontinued     1 g 100 mL/hr over 30 Minutes Intravenous Every 24 hours 11/07/2014 1920 11/05/2014 1926   11/11/2014 1930  azithromycin (ZITHROMAX) 500 mg in dextrose 5 % 250 mL IVPB     500 mg 250 mL/hr over 60 Minutes Intravenous  Every 24 hours 10/27/2014 1920     10/30/2014 1430  cefTRIAXone (ROCEPHIN) 1 g in dextrose 5 % 50 mL IVPB     1 g 100 mL/hr over 30 Minutes Intravenous  Once 10/16/2014 1424 10/30/2014 1532   11/01/2014 1430  azithromycin (ZITHROMAX) 500 mg in dextrose 5 % 250 mL IVPB     500 mg 250 mL/hr over 60 Minutes Intravenous  Once 11/04/2014 1424 10/27/2014 1644      Assessment: 49 yo female with septic shock/PNA on vancomycin and cefepime. WBC= 10.4, afebrile, LA= 8.1, SCr= 2.38 (up from 0.78 on 3/5) and estimated CrCl ~ 40.  Last dose of vancomycin was 1000mg  at about 7am today.  vanc 3/5>> Cefepime 3/5>> azithro 3/5>> tamiflu 3/5  3/5 blood cx x2 3/5 urine 3/6 resp viral panel  Goal of Therapy:  Vancomycin trough level 15-20 mcg/ml  Plan:  -Change cefepime to 2gm IV q24hr -Hold vancomycin and check a random level in am -Will adjust Tamiflu for renal function -Will follow renal function, cultures and clinical progress  Hildred Laser, Pharm D 10/18/2014 11:34 AM

## 2014-10-18 NOTE — Progress Notes (Signed)
PULMONARY / CRITICAL CARE MEDICINE    Name: [name] MRN: [id number] DOB: 26-Dec-1965    ADMISSION DATE:  10/18/2014  PRIMARY SERVICE: PCCM  CHIEF COMPLAINT:  Fatigue  BRIEF PATIENT DESCRIPTION: 49 F with flu-like illness and sudden decompensation concerning for flu --> MRSA. Initially admitted to [facility] but PCCM took over when unable to maintain BPs.  SIGNIFICANT EVENTS / STUDIES:  Lactate 2.66 --> 2.91 --> 2.94 --> 5.88>4.7  3/6 Bradycardic arrest post intubation >brief CPR x 85min w/ epi/HCO3x 2 /calcium/atropine>ROSC   LINES / TUBES: R subclavian CVC 3/5 > ETT 3/6 >> Aline 3/6 >>  CULTURES: Blood culture x2, 3/5 Urine culture, 3/5 Sputum culture, 3/5 Urine Legionella and Pneumooccal Antigens, 3/5- RVP, 3/5  ANTIBIOTICS: Vanc, 3/5 -   Cefepime, 3/5 - Azithro, 3/5 -  Tamiflu, 3/5 -  SUBJECTIVE:   Cont w/ tachypnea , increased WOB despite BIPAP  Remains on pressors  Husband at bedside , updated   Post intubation pt w/ bradycardic arrest brief CPR x 48min w/ epi/HCO3x 2 /calcium/atropine>ROSC   VITAL SIGNS: Temp:  [97.3 F (36.3 C)-98.9 F (37.2 C)] 97.8 F (36.6 C) (03/06 0741) Pulse Rate:  [35-123] 68 (03/06 0630) Resp:  [14-51] 41 (03/06 0700) BP: (39-124)/(22-105) 93/69 mmHg (03/06 0700) SpO2:  [80 %-100 %] 96 % (03/06 0630) FiO2 (%):  [60 %] 60 % (03/06 0342) Weight:  [116.3 kg (256 lb 6.3 oz)] 116.3 kg (256 lb 6.3 oz) (03/05 2034) HEMODYNAMICS: CVP:  [14 mmHg-16 mmHg] 14 mmHg VENTILATOR SETTINGS: Vent Mode:  [-] BIPAP FiO2 (%):  [60 %] 60 % Set Rate:  [15 bmp] 15 bmp INTAKE / OUTPUT: Intake/Output      03/05 0701 - 03/06 0700 03/06 0701 - 03/07 0700   I.V. (mL/kg) 971.6 (8.4)    IV Piggyback 1110    Total Intake(mL/kg) 2081.6 (17.9)    Urine (mL/kg/hr) 60    Total Output 60     Net +2021.6            PHYSICAL EXAMINATION: General:  Obese F , agitated , acute  distress  Neuro:  Agitated  HEENT:  Sclera anicteric, conjunctiva pink, MMM, OP  Clear Neck: Trachea supple and midline, (-) LAN or JVD Cardiovascular:  RRR, NS1/S2, (-) MRG Lungs:  Coarse Rales diffusely Abdomen:  S/NT/ND/(+)BS Musculoskeletal:  (-) C/C/E Skin:  Intact  LABS:  CBC  Recent Labs Lab 10/14/14 1648 10/14/2014 1120 10/18/14 0326  WBC 4.7 6.0 10.4  HGB 10.9* 14.2 14.5  HCT 33.7* 44.4 45.2  PLT 232 251 201   Coag's No results for input(s): APTT, INR in the last 168 hours. BMET  Recent Labs Lab 10/14/14 1648 10/31/2014 1120 10/18/14 0326  NA 135 136 137  K 3.1* 3.2* 4.3  CL 105 103 110  CO2 23 24 19   BUN 8 6 14   CREATININE 0.83 0.78 2.38*  GLUCOSE 99 141* 253*   Electrolytes  Recent Labs Lab 10/14/14 1648 10/20/2014 1120 10/18/14 0326  CALCIUM 7.9* 7.7* 6.5*   Sepsis Markers  Recent Labs Lab 11/08/2014 1829 10/13/2014 2131 10/28/2014 2319  LATICACIDVEN 5.88* 4.0* 4.7*   ABG  Recent Labs Lab 10/18/14 0300  PHART 7.194*  PCO2ART 25.5*  PO2ART 57.4*   Liver Enzymes  Recent Labs Lab 10/28/2014 1120  AST 34  ALT 23  ALKPHOS 39  BILITOT 0.4  ALBUMIN 3.1*   Cardiac Enzymes  Recent Labs Lab 11/12/2014 2131 10/18/14 0210  TROPONINI 0.45* 0.34*   Glucose  Recent  Labs Lab 10/21/2014 2033 10/18/14 0012 10/18/14 0424 10/18/14 0718  GLUCAP 178* 233* 224* 213*    Imaging Dg Chest 2 View  10/20/2014   CLINICAL DATA:  Upper respiratory infection subsequent evaluation, not improving with antibiotics, now with nausea vomiting and diarrhea  EXAM: CHEST  2 VIEW  COMPARISON:  10/14/2014  FINDINGS: Heart size and vascular pattern normal. Bilateral infiltrate in the midlung zones. No pleural effusion or evidence of pulmonary edema. Bony thorax intact.  IMPRESSION: New bilateral mid lung zone infiltrates concerning for bilateral pneumonia.   Electronically Signed   By: Skipper Cliche M.D.   On: 10/26/2014 14:14   Dg Chest Port 1 View  11/12/2014   CLINICAL DATA:  Initial evaluation for shot  EXAM: PORTABLE CHEST - 1 VIEW   COMPARISON:  10/27/2014  FINDINGS: Right subclavian central line has been placed with tip about 1.5 cm above the cavoatrial junction. No pneumothorax.  Mild bilateral patchy perihilar infiltrates again identified. These appear very similar to the recent prior study.  IMPRESSION: Stable bilateral infiltrates.  Central line as described above.   Electronically Signed   By: Skipper Cliche M.D.   On: 10/29/2014 22:09    EKG:  ST  CXR: Reviewed, new diffuse opacities  ASSESSMENT / PLAN:  Active Problems:   HYPERCHOLESTEROLEMIA   Obesity, Class III, BMI 40-49.9 (morbid obesity)   Acute respiratory failure with hypoxia   Bilateral pneumonia   CAP (community acquired pneumonia)   Nausea & vomiting   Fever   Dehydration   Metabolic acidosis   Sepsis   Shock   PULMONARY A: Acute hypoxcic respiratory failure: Likely 2/2 pneumonia +/- ARDS.    3/6 >increased wob and rr despite BIPAP P:   Intubate  Full Vent support  VAP  CXR  Check ABG in 1hr  BD   CARDIOVASCULAR A: Septic Shock: Almost certainly 2/2 pneumonia.  Bradycardic Arrest s/p intubation>brief CPR x 8min w/ epi/HCO3x 2 /calcium/atropine>ROSC  HL: P:   Serial Trops/Lactates Stress dose steroids Check Cortisol>25 NE to maintain MAP >=65 Begin Vasopressin  Check cvp q 4   RENAL A: Hypokalemia:  Hypocalcemia: Acute Renal Failure secondary to shock/hypoperfusion  P:   Replete calcium  Repeat labs in 2 hrs  IVF   GASTROINTESTINAL A: P PPI   HEMATOLOGIC A: No acute issues  INFECTIOUS A: Pneumonia with Concern for Flu:  P:   Tamiflu Empiric Vanc/Cefepime/Azithro Follow-up cultures and deescalate in 48 hrs as appropriate  ENDOCRINE A: Hyperglycemia:  Vit D Def P:   SSI A1c  NEUROLOGIC A: Sedation on vent  RAAS goal -1   BEST PRACTICE / DISPOSITION Level of Care:  ICU Primary Service:  PCCM Consultants:  None Code Status:  Full Diet:  NPO  DVT Px:  Lovenox GI Px:PPI  Skin Integrity:   Intact Social / Family:  Husband updated at bedside  TODAY'S SUMMARY: 49 yo AAF flu like illness with PNA /?ARDS w/ septic shock pressor dependent w/ progressive decompensation requiring emergent intubation/vent support. Brady-arrest s/p intubation w/ brief CPR x 26min w/ epi/HCO3x 2 /calcium/atropine>ROSC .  Tammy Parrett NP-C  [facility]  [phone]    10/18/2014, 7:48 AM  Attending:  I have seen and examined the patient with nurse practitioner/resident and agree with the note above.   49 y/o female admitted yesterday with a flu like illness and septic shock.  Picture consistent with severe community acquired pneumonia.  On exam: in extremis on BIPAP, lungs with crackles  bilaterally  CXR reviewed> diffuse bilateral hazy opacities  Impression/Plan: Acute respiratory failure with hypoxemia> intubate, mechanical ventilation Cardiac arrest> brief, due to severe acidosis around time of intubation Severe community acquired pneumonia> vanc, cefepime, azithro, tamiflu Septic shock> adequately resuscitated, abx in first thing, CVP OK, will add vasopressin, follow serial lactic acids AKI> oliguric, will likely need CVVHD today Metabolic acidosis> continue bicarb gtt for now  My cc time 90 minutes  [name], MD [place] [facility] Pager: [phone] Cell: [phone] If no response, call [phone]

## 2014-10-18 NOTE — Progress Notes (Signed)
CRITICAL VALUE ALERT  Critical value received:  Latic acid 4.7  Date of notification:  10/18/2014  Time of notification:  0020  Critical value read back:Yes.    Nurse who received alert:  Allegra Grana, RN  MD notified (1st page):  Warren Lacy MD  Time of first page:  0021  MD notified (2nd page):  Time of second page:  Responding MD:  Warren Lacy MD  Time MD responded:  [phone]

## 2014-10-18 NOTE — Progress Notes (Signed)
Notified Elink of pt's hypotension and HR of 130-140, pt remains on max dose levophed and vasopressin at this time.  No new orders, MAP currently less than 65, CVP 14 and with poor UO.

## 2014-10-18 NOTE — Consult Note (Signed)
Reason for Consult:ARF Referring Physician: Lake Bells, MD  [name] is an 49 y.o. female.  HPI: Pt is a 49yo AAF with PMH sig for obesity and hyperlipidemia who presented to [facility] on 10/14/14 with complaints of fatigue, malaise and SOB. She had a clear CXR and was treated with IVF's and discharged home only to return on 11/11/2014 with worsening symptoms.  [name] is a 62 F with HL who presents to [facility] with a one week history of worsening fatigue and dyspnea.  She continued to feel worse throughout the week now complaining of headaches, fevers, chills, N/V, D, productive cough, and pleuritic chest pain.  She was admitted for possible influenza (she did not receive a vaccine this year) and her clinical course deteriorated and developed SIRS with hypotension and increased work of breathing.  She was started on bipap, however she had progressive hypercapnic respiratory failure and acidosis.  She was transferred to the ICU and started on pressors as well as intubated.  We were asked to help evaluate and manage her ARF and acidemia.  Of note, her pH has improved since intubation and was most recently 7.34.  She is starting to make a little urine.    Trend in Creatinine:  CREATININE, SER  Date/Time Value Ref Range Status  10/18/2014 03:26 AM 2.38* 0.50 - 1.10 mg/dL Final  11/10/2014 11:20 AM 0.78 0.50 - 1.10 mg/dL Final  10/14/2014 04:48 PM 0.83 0.50 - 1.10 mg/dL Final  10/25/2012 05:01 PM 0.90 0.50 - 1.10 mg/dL Final  06/27/2010 01:44 AM 0.93 0.4 - 1.2 mg/dL Final  12/24/2009 03:47 PM 0.86 0.4 - 1.2 mg/dL Final    PMH:   Past Medical History  Diagnosis Date  . Hyperlipidemia   . Vitamin D deficiency disease   . Morbid obesity     PSH:   Past Surgical History  Procedure Laterality Date  . Breast surgery  1993    reduction  . Tubal ligation  2003  . Foot surgery  2008     rt foot tarpals   . Knee surgery  2012    rt knee   . Laparoscopic cholecystectomy      Allergies: No Known  Allergies  Medications:   Prior to Admission medications   Medication Sig Start Date End Date Taking? Authorizing Provider  HYDROcodone-acetaminophen (NORCO/VICODIN) 5-325 MG per tablet Take 1 tablet by mouth every 6 (six) hours as needed for moderate pain. 10/14/14  Yes Evelina Bucy, MD  pravastatin (PRAVACHOL) 40 MG tablet Take 40 mg by mouth daily.  09/27/12  Yes Historical Provider, MD  Vitamin D, Ergocalciferol, (DRISDOL) 50000 UNITS CAPS Take 50,000 Units by mouth 2 (two) times a week.  10/07/12  Yes Historical Provider, MD    Inpatient medications: . antiseptic oral rinse  7 mL Mouth Rinse QID  . azithromycin  500 mg Intravenous Q24H  . [START ON Nov 08, 2014] ceFEPime (MAXIPIME) IV  2 g Intravenous Q24H  . chlorhexidine  15 mL Mouth Rinse BID  . enoxaparin (LOVENOX) injection  40 mg Subcutaneous Q24H  . hydrocortisone sod succinate (SOLU-CORTEF) inj  50 mg Intravenous Q8H  . [START ON 11/08/2014] Influenza vac split quadrivalent PF  0.5 mL Intramuscular Tomorrow-1000  . insulin aspart  1-3 Units Subcutaneous 6 times per day  . ipratropium-albuterol  3 mL Nebulization Q4H  . oseltamivir  30 mg Per Tube BID  . pantoprazole (PROTONIX) IV  40 mg Intravenous Daily  . [START ON 2014/11/08] pneumococcal 23 valent vaccine  0.5 mL  Intramuscular Tomorrow-1000    Discontinued Meds:   Medications Discontinued During This Encounter  Medication Reason  . cefTRIAXone (ROCEPHIN) 1 g in dextrose 5 % 50 mL IVPB   . 0.9 %  sodium chloride infusion   . ondansetron (ZOFRAN) injection 4 mg Duplicate - PRN Policy  . potassium chloride (KLOR-CON) packet 40 mEq   . norepinephrine (LEVOPHED) 4 mg in dextrose 5 % 250 mL (0.016 mg/mL) infusion   . fentaNYL (SUBLIMAZE) 0.05 MG/ML injection Returned to ADS  . midazolam (VERSED) 2 MG/2ML injection Returned to ADS  . propofol (DIPRIVAN) 10 mg/ml infusion Returned to ADS  . propofol (DIPRIVAN) 10 mg/mL bolus/IV push Returned to ADS  . acetaminophen (TYLENOL) tablet  650 mg   . HYDROcodone-acetaminophen (NORCO/VICODIN) 5-325 MG per tablet 1 tablet   . morphine 2 MG/ML injection 1 mg   . promethazine (PHENERGAN) injection 25 mg   . albuterol (PROVENTIL) (2.5 MG/3ML) 0.083% nebulizer solution 2.5 mg   . ipratropium (ATROVENT) nebulizer solution 0.5 mg   . oseltamivir (TAMIFLU) capsule 75 mg   . AMBULATORY NON FORMULARY MEDICATION Error  . vancomycin (VANCOCIN) IVPB 1000 mg/200 mL premix   . ceFEPIme (MAXIPIME) 2 g in dextrose 5 % 50 mL IVPB   . oseltamivir (TAMIFLU) 6 MG/ML suspension 75 mg     Social History:  reports that she quit smoking about 3 months ago. Her smoking use included Cigarettes. She smoked 0.50 packs per day. She does not have any smokeless tobacco history on file. She reports that she drinks alcohol. Her drug history is not on file.  Family History:   Family History  Problem Relation Age of Onset  . Cancer Mother     ovarian ca  . Diabetes Father     Review of systems not obtained due to patient factors. Weight change:   Intake/Output Summary (Last 24 hours) at 10/18/14 1135 Last data filed at 10/18/14 1000  Gross per 24 hour  Intake 2081.57 ml  Output    110 ml  Net 1971.57 ml   BP 110/85 mmHg  Pulse 132  Temp(Src) 97.8 F (36.6 C) (Oral)  Resp 35  Ht 5\' 7"  (1.702 m)  Wt 116.3 kg (256 lb 6.3 oz)  BMI 40.15 kg/m2  SpO2 100%  LMP 10/07/2014 Filed Vitals:   10/18/14 0930 10/18/14 0945 10/18/14 1000 10/18/14 1015  BP:      Pulse:  84 132   Temp:      TempSrc:      Resp: 35 29 35 35  Height:      Weight:      SpO2:  67% 100%      General appearance: alert, cooperative and intubated Head: Normocephalic, without obvious abnormality, atraumatic Resp: clear to auscultation bilaterally Cardio: tachycardic, no rub GI: soft, non-tender; bowel sounds normal; no masses,  no organomegaly Extremities: extremities normal, atraumatic, no cyanosis or edema  Labs: Basic Metabolic Panel:  Recent Labs Lab  10/14/14 1648 11/03/2014 1120 10/18/14 0326  NA 135 136 137  K 3.1* 3.2* 4.3  CL 105 103 110  CO2 23 24 19   GLUCOSE 99 141* 253*  BUN 8 6 14   CREATININE 0.83 0.78 2.38*  ALBUMIN  --  3.1*  --   CALCIUM 7.9* 7.7* 6.5*   Liver Function Tests:  Recent Labs Lab 10/24/2014 1120  AST 34  ALT 23  ALKPHOS 39  BILITOT 0.4  PROT 6.3  ALBUMIN 3.1*   No results for input(s): LIPASE, AMYLASE in  the last 168 hours. No results for input(s): AMMONIA in the last 168 hours. CBC:  Recent Labs Lab 10/14/14 1648 10/16/2014 1120 10/18/14 0326  WBC 4.7 6.0 10.4  NEUTROABS  --  4.6  --   HGB 10.9* 14.2 14.5  HCT 33.7* 44.4 45.2  MCV 84.3 86.0 86.1  PLT 232 251 201   PT/INR: @LABRCNTIP (inr:5) Cardiac Enzymes: ) Recent Labs Lab 10/16/2014 2131 10/18/14 0210 10/18/14 0820  TROPONINI 0.45* 0.34* 0.90*   CBG:  Recent Labs Lab 10/27/2014 2033 10/18/14 0012 10/18/14 0424 10/18/14 0718  GLUCAP 178* 233* 224* 213*    Iron Studies: No results for input(s): IRON, TIBC, TRANSFERRIN, FERRITIN in the last 168 hours.  Xrays/Other Studies: Dg Chest 2 View  10/23/2014   CLINICAL DATA:  Upper respiratory infection subsequent evaluation, not improving with antibiotics, now with nausea vomiting and diarrhea  EXAM: CHEST  2 VIEW  COMPARISON:  10/14/2014  FINDINGS: Heart size and vascular pattern normal. Bilateral infiltrate in the midlung zones. No pleural effusion or evidence of pulmonary edema. Bony thorax intact.  IMPRESSION: New bilateral mid lung zone infiltrates concerning for bilateral pneumonia.   Electronically Signed   By: Skipper Cliche M.D.   On: 11/11/2014 14:14   Portable Chest Xray  10/18/2014   CLINICAL DATA:  Acute respiratory failure. Endotracheal tube placement. Evaluate support apparatus.  EXAM: PORTABLE CHEST - 1 VIEW  COMPARISON:  11/12/2014 at 2039 hours.  FINDINGS: Support apparatus: Endotracheal tube tip is present with the tip 13 mm from the carina. RIGHT subclavian central  line with the tip in the mid SVC a about at the level of the carina. Enteric tube is present with the tip not visible. Monitoring leads project over the chest.  Cardiomediastinal Silhouette:  Borderline for projection.  Lungs: Marked interval increase and bilateral perihilar and basilar predominant airspace disease. The primary differential considerations are pulmonary edema and/ or multifocal pneumonia. No pneumothorax.  Effusions:  None visible.  Other:  None.  IMPRESSION: 1. Interval intubation with the endotracheal tube 13 mm from the carina. 2. Enteric tube now present with the tip not visible. 3. Unchanged RIGHT subclavian central line. 4. Marked worsening of bilateral airspace disease.   Electronically Signed   By: Dereck Ligas M.D.   On: 10/18/2014 09:13   Dg Chest Port 1 View  11/12/2014   CLINICAL DATA:  Initial evaluation for shot  EXAM: PORTABLE CHEST - 1 VIEW  COMPARISON:  11/08/2014  FINDINGS: Right subclavian central line has been placed with tip about 1.5 cm above the cavoatrial junction. No pneumothorax.  Mild bilateral patchy perihilar infiltrates again identified. These appear very similar to the recent prior study.  IMPRESSION: Stable bilateral infiltrates.  Central line as described above.   Electronically Signed   By: Skipper Cliche M.D.   On: 10/31/2014 22:09   Dg Abd Portable 1v  10/18/2014   CLINICAL DATA:  Orogastric tube placement.  Radiographic evaluation.  EXAM: PORTABLE ABDOMEN - 1 VIEW  COMPARISON:  None.  FINDINGS: Feeding tube is present with the tip either in the antrum of the stomach or in the duodenal bulb. Restrained motion artifact is present on the abdominal radiograph, degrading evaluation. Cholecystectomy clips are present in the right upper quadrant.  IMPRESSION: Enteric tube either in the distal stomach or in the duodenal bulb.   Electronically Signed   By: Dereck Ligas M.D.   On: 10/18/2014 09:22     Assessment/Plan: 1.  ARF- in setting of VDRF and  SIRS  requiring pressors.  Most consistent with ischemic ATN but will start workup for ARF and r/o pulmonary-renal syndrome. 1. Renal dose meds (pt is in ARF and therefore does not have a GFR of 38.5 as calculated by the Cockcroft-Gault equation and therefore agree with holding vanco and dosing cefipime q24 hours) 2. VDRF- per PCCM 3. SIRS- possibly related to influenza.  CXR c/w bilateral pneumonia.  On antibiotics, tamiflu, and pressors.  Plan per PCCM 4. Cardiac arrest post intubation- PEA/bradycardia, treated by PCCM.   5. Acidemia- improved after intubation and continue with IV bicarb 6. Hyperglycemia- ?new diagnosis of DM or related to acute illness.  Cont to follow. 7. Hypocalcemia- likely due to bicarb therapy, replete and follow.   [name] A 10/18/2014, 11:35 AM

## 2014-10-18 NOTE — Procedures (Signed)
Arterial Line Insertion Procedure Note [name] [id number] 03/17/1966  Procedure: Insertion of Arterial Line Indications: Frequent blood sampling, measurement of blood pressure  Procedure Details Consent: Unable to obtain consent because of emergent medical necessity. Time Out: Verified patient identification, verified procedure, site/side was marked, verified correct patient position, special equipment/implants available, medications/allergies/relevent history reviewed, required imaging and test results available.  Performed  Maximum sterile technique was used including antiseptics, cap, gloves, gown, hand hygiene, mask and sheet. Skin prep: Chlorhexidine; local anesthetic administered A single lumen arterial catheter was placed in the left radial artery using the Seldinger technique.  Ultrasound was used to verify the patency of the vein and for real time needle guidance.  Evaluation Blood flow good Complications: No apparent complications Patient did tolerate procedure well.  [name], MD [place] [facility] Pager: [phone] Cell: [phone] If no response, call [phone]   Simonne Maffucci 10/18/2014, 8:40 AM

## 2014-10-18 NOTE — Progress Notes (Signed)
S: Called by e-link to evaluate patient at bedside for progressive hypotension and tachycardia.  O: Pt is on vasopressin and high dose levophed (50/hr) and is sedated on versed (2/hr) and fentanyl. She is paralyzed on cis infusion. On exam, her extremities are cool. Ultrasound of her abdomen showed full IVC that is not compressable and is without respiratory variation.  A/P: Shock: certainly septic, may also be cardiogenic. Fluid replete. Will check central venous oxygen saturation to assess for cardiogenic component to her shock. Add phenylephrine in the meantime.  Luz Brazen, MD Pulmonary & Critical Care Medicine October 18, 2014, 10:15 PM

## 2014-10-18 NOTE — Progress Notes (Signed)
[facility]  Severe hypoxemia> ARDS Changed to ARDS protocol, added nimbex> ABG looks OK Appreciate renal assistance Family updated  [name], MD [place] [facility] Pager: [phone] Cell: [phone] If no response, call [phone]

## 2014-10-18 NOTE — Progress Notes (Signed)
Spoke to Dr Lowry Ram who is on the floor about pt seeming to be more restless and increased respiratory and heart rate. New order for ABG. RT on floor and made aware.  Also order to place foley as pt has not felt the urge to void since having in/out. Foley placed per order.

## 2014-10-18 NOTE — Progress Notes (Signed)
Called pts husband earlier tonight to give him an update and pts husband is now at bedside and has been updated on pts condition.   After talking to pt and pts husband about the possibility of needing to be intubated the pt and her husband agree that if need be they consent to intubation.  Spoke to [facility] MD about the above conversation [facility] MD also again made aware of liable blood pressures. Despite changing BP cuff and cuff locations; SBP continues to be anywhere between 70-110's. Several manual BP taken have been 95's/70's.  Will continue to monitor very closely.  Support given to pt and her husband.

## 2014-10-18 NOTE — Progress Notes (Signed)
Bladder Scan done > 126 ml urine..Tammt P. , ccm NP made aware

## 2014-10-18 NOTE — Progress Notes (Signed)
Discussed ABG with patient. She is tachpynic but does not feel that she is tiring out. She would prefer to try BiPAP for a bit. Given her appearance and the bicarb that Dr. Jimmy Footman has given her I think a BiPAP trial is not unreasonable.   Will get VBG now (patient is tough ABG stick) and then another in 2 hrs.

## 2014-10-18 NOTE — Progress Notes (Signed)
eLink Physician-Brief Progress Note Patient Name: [name] DOB: [birth year] MRN: [id number]   Date of Service  10/18/2014  HPI/Events of Note  Patient with metabolic acidosis and hypoxia with elevated RR.  PH 7.19/25/57/9.5  eICU Interventions  Plan: Fellow to intubate 2 amps of bicarb IVP Bicarb gtt     Intervention Category Major Interventions: Acid-Base disturbance - evaluation and management;Respiratory failure - evaluation and management  Terree Gaultney 10/18/2014, 3:14 AM

## 2014-10-18 NOTE — Progress Notes (Signed)
CRITICAL VALUE ALERT  Critical value received:   Ph= 7.19 PC02= 25 P02= 57 HCO3= 9.5  Date of notification:  10/18/14  Time of notification:  0320  Critical value read back:Yes.    Nurse who received alert:  Allegra Grana, RN  MD notified (1st page):  Warren Lacy MD  Time of first page:  0320  MD notified (2nd page):  Time of second page:  Responding MD:  Warren Lacy MD  Time MD responded:  [phone]

## 2014-10-18 NOTE — Progress Notes (Signed)
Rexene Edison, CCM NP made aware of critical labs  And kidney function

## 2014-10-18 NOTE — Procedures (Signed)
Intubation Procedure Note [name] [id number] 09/17/1965  Procedure: Intubation Indications: Airway protection and maintenance  Procedure Details Consent: Risks of procedure as well as the alternatives and risks of each were explained to the (patient/caregiver).  Consent for procedure obtained. Time Out: Verified patient identification, verified procedure, site/side was marked, verified correct patient position, special equipment/implants available, medications/allergies/relevent history reviewed, required imaging and test results available.  Performed  Drugs Versed 2mg , Etomidate 20mg , Rocuronium 100mg  DL x 1 with GS3 blade Grade 1 view 8.0 tube passed through cords under video visualization, secured at 24cm Placement confirmed with bilateral breath sounds, positive EtCO2 change and smoke in tube   Evaluation Hemodynamic Status: BP stable throughout; O2 sats: stable throughout Patient's Current Condition: stable Complications: No apparent complications Patient did tolerate procedure well. Chest X-ray ordered to verify placement.  CXR: pending.  Andrea Awkward, MD [place] [facility] Pager: [phone] Cell: [phone] If no response, call [phone]     [name] 10/18/2014

## 2014-10-18 NOTE — Progress Notes (Signed)
[facility] Code documentation  Cardiac arrest post intubation> PEA, bradycardic  CPR performed for one minute Given 1 amp epinephrine, 1 amp atropine, 2 amps bicarb, 1 amp calcium ROSC after one minute  [name], MD [place] [facility] Pager: [phone] Cell: [phone] If no response, call [phone]

## 2014-10-18 NOTE — Progress Notes (Signed)
[facility] Progress Note Patient Name: [place] Paone DOB: 1966/06/04 MRN: [id number]   Date of Service  10/18/2014  HPI/Events of Note  Nurse calling reported patient is anxious, not relieved with prn morphine.  MAP is 63 with HR of 102 and sats on 4L [place] 100%.  No excessive increased WOB  eICU Interventions  Plan: 1 time dose of ativan 1 mg IV     Intervention Category Minor Interventions: Routine modifications to care plan (e.g. PRN medications for pain, fever)  Tanith Dagostino 10/18/2014, 12:18 AM

## 2014-10-19 ENCOUNTER — Inpatient Hospital Stay (HOSPITAL_COMMUNITY): Payer: Medicaid Other

## 2014-10-19 DIAGNOSIS — A419 Sepsis, unspecified organism: Secondary | ICD-10-CM

## 2014-10-19 LAB — CBC
HCT: 39.5 % (ref 36.0–46.0)
HEMOGLOBIN: 12.8 g/dL (ref 12.0–15.0)
MCH: 27.4 pg (ref 26.0–34.0)
MCHC: 32.4 g/dL (ref 30.0–36.0)
MCV: 84.6 fL (ref 78.0–100.0)
PLATELETS: 141 10*3/uL — AB (ref 150–400)
RBC: 4.67 MIL/uL (ref 3.87–5.11)
RDW: 14.5 % (ref 11.5–15.5)
WBC: 17.5 10*3/uL — ABNORMAL HIGH (ref 4.0–10.5)

## 2014-10-19 LAB — BLOOD GAS, ARTERIAL
ACID-BASE DEFICIT: 13.9 mmol/L — AB (ref 0.0–2.0)
BICARBONATE: 12.2 meq/L — AB (ref 20.0–24.0)
DRAWN BY: 24513
FIO2: 0.3 %
LHR: 35 {breaths}/min
MECHVT: 320 mL
O2 SAT: 92.9 %
PCO2 ART: 31.1 mmHg — AB (ref 35.0–45.0)
PEEP: 8 cmH2O
Patient temperature: 99
TCO2: 13.2 mmol/L (ref 0–100)
pH, Arterial: 7.22 — ABNORMAL LOW (ref 7.350–7.450)
pO2, Arterial: 91 mmHg (ref 80.0–100.0)

## 2014-10-19 LAB — POCT I-STAT 3, ART BLOOD GAS (G3+)
Acid-base deficit: 9 mmol/L — ABNORMAL HIGH (ref 0.0–2.0)
Acid-base deficit: 8 mmol/L — ABNORMAL HIGH (ref 0.0–2.0)
Acid-base deficit: 9 mmol/L — ABNORMAL HIGH (ref 0.0–2.0)
BICARBONATE: 16.1 meq/L — AB (ref 20.0–24.0)
BICARBONATE: 16.7 meq/L — AB (ref 20.0–24.0)
BICARBONATE: 15.8 meq/L — AB (ref 20.0–24.0)
O2 SAT: 92 %
O2 Saturation: 94 %
O2 Saturation: 93 %
PCO2 ART: 33.5 mmHg — AB (ref 35.0–45.0)
PCO2 ART: 32.5 mmHg — AB (ref 35.0–45.0)
PH ART: 7.308 — AB (ref 7.350–7.450)
PH ART: 7.295 — AB (ref 7.350–7.450)
PO2 ART: 66 mmHg — AB (ref 80.0–100.0)
PO2 ART: 78 mmHg — AB (ref 80.0–100.0)
PO2 ART: 76 mmHg — AB (ref 80.0–100.0)
Patient temperature: 97.5
Patient temperature: 99.3
Patient temperature: 99
TCO2: 17 mmol/L (ref 0–100)
TCO2: 18 mmol/L (ref 0–100)
TCO2: 17 mmol/L (ref 0–100)
pCO2 arterial: 32.2 mmHg — ABNORMAL LOW (ref 35.0–45.0)
pH, Arterial: 7.304 — ABNORMAL LOW (ref 7.350–7.450)

## 2014-10-19 LAB — LEGIONELLA ANTIGEN, URINE

## 2014-10-19 LAB — BASIC METABOLIC PANEL
ANION GAP: 15 (ref 5–15)
BUN: 21 mg/dL (ref 6–23)
CALCIUM: 5.7 mg/dL — AB (ref 8.4–10.5)
CO2: 16 mmol/L — ABNORMAL LOW (ref 19–32)
Chloride: 107 mmol/L (ref 96–112)
Creatinine, Ser: 3.6 mg/dL — ABNORMAL HIGH (ref 0.50–1.10)
GFR calc Af Amer: 16 mL/min — ABNORMAL LOW (ref >90)
GFR, EST NON AFRICAN AMERICAN: 14 mL/min — AB (ref >90)
Glucose, Bld: 204 mg/dL — ABNORMAL HIGH (ref 70–99)
Potassium: 3.9 mmol/L (ref 3.5–5.1)
Sodium: 138 mmol/L (ref 135–145)

## 2014-10-19 LAB — URINE CULTURE: Colony Count: 6000

## 2014-10-19 LAB — GLUCOSE, CAPILLARY
GLUCOSE-CAPILLARY: 187 mg/dL — AB (ref 70–99)
GLUCOSE-CAPILLARY: 155 mg/dL — AB (ref 70–99)
Glucose-Capillary: 225 mg/dL — ABNORMAL HIGH (ref 70–99)

## 2014-10-19 LAB — MAGNESIUM: Magnesium: 1.7 mg/dL (ref 1.5–2.5)

## 2014-10-19 LAB — LACTIC ACID, PLASMA
LACTIC ACID, VENOUS: 6.1 mmol/L — AB (ref 0.5–2.0)
LACTIC ACID, VENOUS: 10.8 mmol/L — AB (ref 0.5–2.0)

## 2014-10-19 LAB — VANCOMYCIN, RANDOM: VANCOMYCIN RM: 16.1 ug/mL

## 2014-10-19 LAB — HEMOGLOBIN A1C
HEMOGLOBIN A1C: 5.4 % (ref 4.8–5.6)
MEAN PLASMA GLUCOSE: 108 mg/dL

## 2014-10-19 LAB — HIV ANTIBODY (ROUTINE TESTING W REFLEX): HIV Screen 4th Generation wRfx: NONREACTIVE

## 2014-10-19 LAB — PHOSPHORUS: Phosphorus: 5.6 mg/dL — ABNORMAL HIGH (ref 2.3–4.6)

## 2014-10-19 MED ORDER — METOPROLOL TARTRATE 1 MG/ML IV SOLN
2.5000 mg | Freq: Once | INTRAVENOUS | Status: AC
  Administered 2014-10-19: 2.5 mg via INTRAVENOUS

## 2014-10-19 MED ORDER — EPINEPHRINE HCL 0.1 MG/ML IJ SOSY
PREFILLED_SYRINGE | INTRAMUSCULAR | Status: AC
  Filled 2014-10-19: qty 20

## 2014-10-19 MED ORDER — DOBUTAMINE IN D5W 4-5 MG/ML-% IV SOLN
10.0000 ug/kg/min | INTRAVENOUS | Status: DC
  Administered 2014-10-19: 10 ug/kg/min via INTRAVENOUS
  Filled 2014-10-19: qty 250

## 2014-10-19 MED ORDER — EPINEPHRINE HCL 1 MG/ML IJ SOLN
0.5000 ug/min | INTRAVENOUS | Status: DC
  Administered 2014-10-19: 10 ug/min via INTRAVENOUS
  Filled 2014-10-19: qty 4

## 2014-10-19 MED ORDER — METOPROLOL TARTRATE 1 MG/ML IV SOLN
INTRAVENOUS | Status: AC
Start: 2014-10-19 — End: 2014-10-19
  Filled 2014-10-19: qty 5

## 2014-10-20 LAB — BLOOD GAS, VENOUS
ACID-BASE DEFICIT: 1.4 mmol/L (ref 0.0–2.0)
Bicarbonate: 23.8 mEq/L (ref 20.0–24.0)
O2 Saturation: 53.3 %
PATIENT TEMPERATURE: 98.6
TCO2: 25.3 mmol/L (ref 0–100)
pCO2, Ven: 47.4 mmHg (ref 45.0–50.0)
pH, Ven: 7.322 — ABNORMAL HIGH (ref 7.250–7.300)
pO2, Ven: 35 mmHg (ref 30.0–45.0)

## 2014-10-20 MED FILL — Medication: Qty: 1 | Status: AC

## 2014-10-21 ENCOUNTER — Telehealth: Payer: Self-pay

## 2014-10-21 LAB — CULTURE, BLOOD (ROUTINE X 2)
CULTURE: NO GROWTH
CULTURE: NO GROWTH

## 2014-10-21 NOTE — Telephone Encounter (Signed)
Received death certificate 02/16/04 from [facility].  Will take to Pulmonary and leave for Dr. Lake Bells to sign tomorrow afternoon.  Received back 10/23/14 and called for pick-up.

## 2014-10-23 NOTE — Discharge Summary (Signed)
[name], [name]             ACCOUNT NO.:  [id number]  MEDICAL RECORD NO.:  [id number]  LOCATION:  2M07C                        FACILITY:  [facility]  PHYSICIAN:  Norlene Campbell, MDDATE OF BIRTH:  Nov 22, 1965  DATE OF ADMISSION:  11/01/2014 DATE OF DISCHARGE:  2014/11/17                              DISCHARGE SUMMARY   DEATH SUMMARY  DATE OF DEATH:  11-17-2014.  CAUSE OF DEATH: 1. Septic shock. 2. Severe community-acquired pneumonia. 3. Acute respiratory failure with hypoxemia. 4. Acute kidney injury.  HISTORY OF PRESENT ILLNESS:  [name] who was admitted to [facility] on October 17, 2014 with a flu-like illness.  She was admitted to the intensive care unit for further management and hypoxemic respiratory failure as well as septic shock and acute kidney injury.  Past medical history, family history, social history; see the admission H and P.  PHYSICAL EXAMINATION:  VITAL SIGNS:  Last recorded per progress note on the day prior to the patient's death; temperature 97.8, respirations 41, pulse rate 68, blood pressure 93/69, SpO2 of 96% on 100% BiPAP. GENERAL:  Obese female, agitated, acute distress. NEURO:  Agitated. HEENT:  Sclerae anicteric.  Conjunctiva pink.  Mucous membranes moist, oropharynx clear. NECK:  Trachea is supple in midline. CARDIOVASCULAR:  Regular rate and rhythm.  No murmurs, gallops, rubs. LUNGS:  Coarse crackles diffusely. ABDOMEN:  Soft, nontender.  Bowel sounds are positive. MUSCULOSKELETAL:  No clubbing, cyanosis, or edema.  HOSPITAL COURSE:  The patient was admitted to the medical intensive care unit for further management of septic shock secondary to severe community-acquired pneumonia.  The central line was placed, the patient was given broad-spectrum antibiotics and treated with Levophed for septic shock.  She was also given IV fluids.  Unfortunately, the patient's respiratory status worsened and she  developed worsening metabolic acidosis secondary to acute kidney injury.  On October 18, 2014, she was intubated for worsening respiratory failure.  This was followed by a brief cardiac arrest in the setting of pulseless electrical activity, which is felt to be secondary to the patient's severe metabolic acidosis in the setting of septic shock and renal failure. She was aggressively resuscitated and renal was consulted for possible hemodialysis.  Electrolytes were replaced and broad-spectrum antibiotics were continued.  However, over the course of the next 24 hours, the patient's respiratory status thus stable, was noted to have worsening oxygenation on the ventilator.  Further, her septic shock became difficult to manage.  Unfortunately, early in the morning on 2014-11-17, she developed repeated episodes of cardiac arrest and over the course of 2 hours, she was aggressively resuscitated with CPR on a repeated basis. After over 2 hours, the patient's family was consulted and it was felt ineffective care with repeated episodes of CPR.  The patient was pronounced dead on 11-17-2014 at 5:21 a.m.          ______________________________ Norlene Campbell, MD     DBM/MEDQ  D:  10/22/2014  T:  10/23/2014  Job:  [id number]

## 2014-10-24 LAB — CULTURE, BLOOD (ROUTINE X 2)
Culture: NO GROWTH
Culture: NO GROWTH

## 2014-10-24 LAB — RESPIRATORY VIRUS PANEL
Adenovirus: NEGATIVE
Influenza A: POSITIVE — AB
Influenza B: NEGATIVE
METAPNEUMOVIRUS: NEGATIVE
PARAINFLUENZA 1 A: NEGATIVE
Parainfluenza 2: NEGATIVE
Parainfluenza 3: NEGATIVE
RESPIRATORY SYNCYTIAL VIRUS B: NEGATIVE
RHINOVIRUS: NEGATIVE
Respiratory Syncytial Virus A: NEGATIVE

## 2014-11-13 NOTE — Progress Notes (Addendum)
Time of death 35 on November 03, 2014.  Pt with no respirations or heart tones, fixed and dilated pupils, verified by Pam Drown RN and Candelaria Celeste RN.  Asystole on the monitor.  Family at bedside, Dr. Ancil Linsey aware and also at bedside.  Chaplain notified and provided presence for family during their time of need.

## 2014-11-13 NOTE — Progress Notes (Signed)
CRITICAL VALUE ALERT  Critical value received:  Lactic Acid 6.1  Date of notification:  10-26-2014  Time of notification: 0312  Critical value read back:Yes.    Nurse who received alert:  Amedeo Plenty, RN  MD notified (1st page):  At bedside  Time of first page:  0312  MD notified (2nd page):  Time of second page:  Responding MD:  Ancil Linsey  Time MD responded: [phone]

## 2014-11-13 NOTE — Progress Notes (Addendum)
eLink Physician-Brief Progress Note Patient Name: [name] DOB: 11-02-1965 MRN: [id number]   Date of Service  Nov 16, 2014  HPI/Events of Note  Refractory shock, narrow pulse pressure (which would be unusual for septic shock), elevated CVP, progressive tachycardia, low voltages with possible electrical alternans on EKG   eICU Interventions  Fellow to perform quick look echocardiogram to assess for possible pericardial effusion. If present, will need urgent Cards eval and formal echocardiogram to r/o tamponade     Intervention Category Major Interventions: Shock - evaluation and management  Merton Border 11/16/2014, 1:31 AM

## 2014-11-13 NOTE — Progress Notes (Signed)
Chaplain responded to request by the Nursing Staff the patient was declining and a code was called.  Upon arrival to the Unit family was present and visibly upset over patient's medical status, the ministry of presence and comfort measures were offered to support the husband, son, and other family members presence.  The Dr. Informed them of the critical  status of the patient and all medical interventions were being done for the patient.  On-going follow-up will continue as needed to support the family.  Chaplain was requested back to Unit because the patient coded again, but was unable to be revived and died.  The family was present and very emotionally upset over death.  Words of comfort and peace were offered as well as other comfort measures needed. The Chaplain informed the family of all information needed for the Nursing Staff for final arrangements for the patient.  A patient placement card was given to the husband once funeral information was known. They were appreciative of all support. On-Call Chaplain Yaakov Guthrie [phone]

## 2014-11-13 NOTE — Progress Notes (Signed)
Notified Elink MD of pt's HR sustained at 150s and coarse crackles on auscultation.  Order for Bicarb gtt d/c, attempting to wean off levophed and increase phenylephrine gtt to decrease HR and maintain MAP >65.

## 2014-11-13 NOTE — Progress Notes (Signed)
Wasted remaining 75 mls of Fentanyl from 250 ml bag into sink and remaining 25 mls of versed into sink.  Witnessed by [employer] in room [phone].

## 2014-11-13 NOTE — Progress Notes (Signed)
Over the last 2 hours, patient has repeatedly developed cardiac arrest, responded to chest compressions and epinephrine injections. No clear reversible causes of cardiac arrest were identified. Frequency of arrests increased and as another impending arrest approached, reccommended to husband DNR status, which he agreed. Patient progressed into arrest and died.  Luz Brazen, MD Pulmonary & Critical Care Medicine 11/02/14, 5:34 AM

## 2014-11-13 DEATH — deceased

## 2016-12-29 IMAGING — CR DG CHEST 1V PORT
1 series · 1 of 1 positions shown · non-contrast
Comparison: 10/17/2014

CLINICAL DATA: Initial evaluation for shot

EXAM:
PORTABLE CHEST - 1 VIEW

[AP]
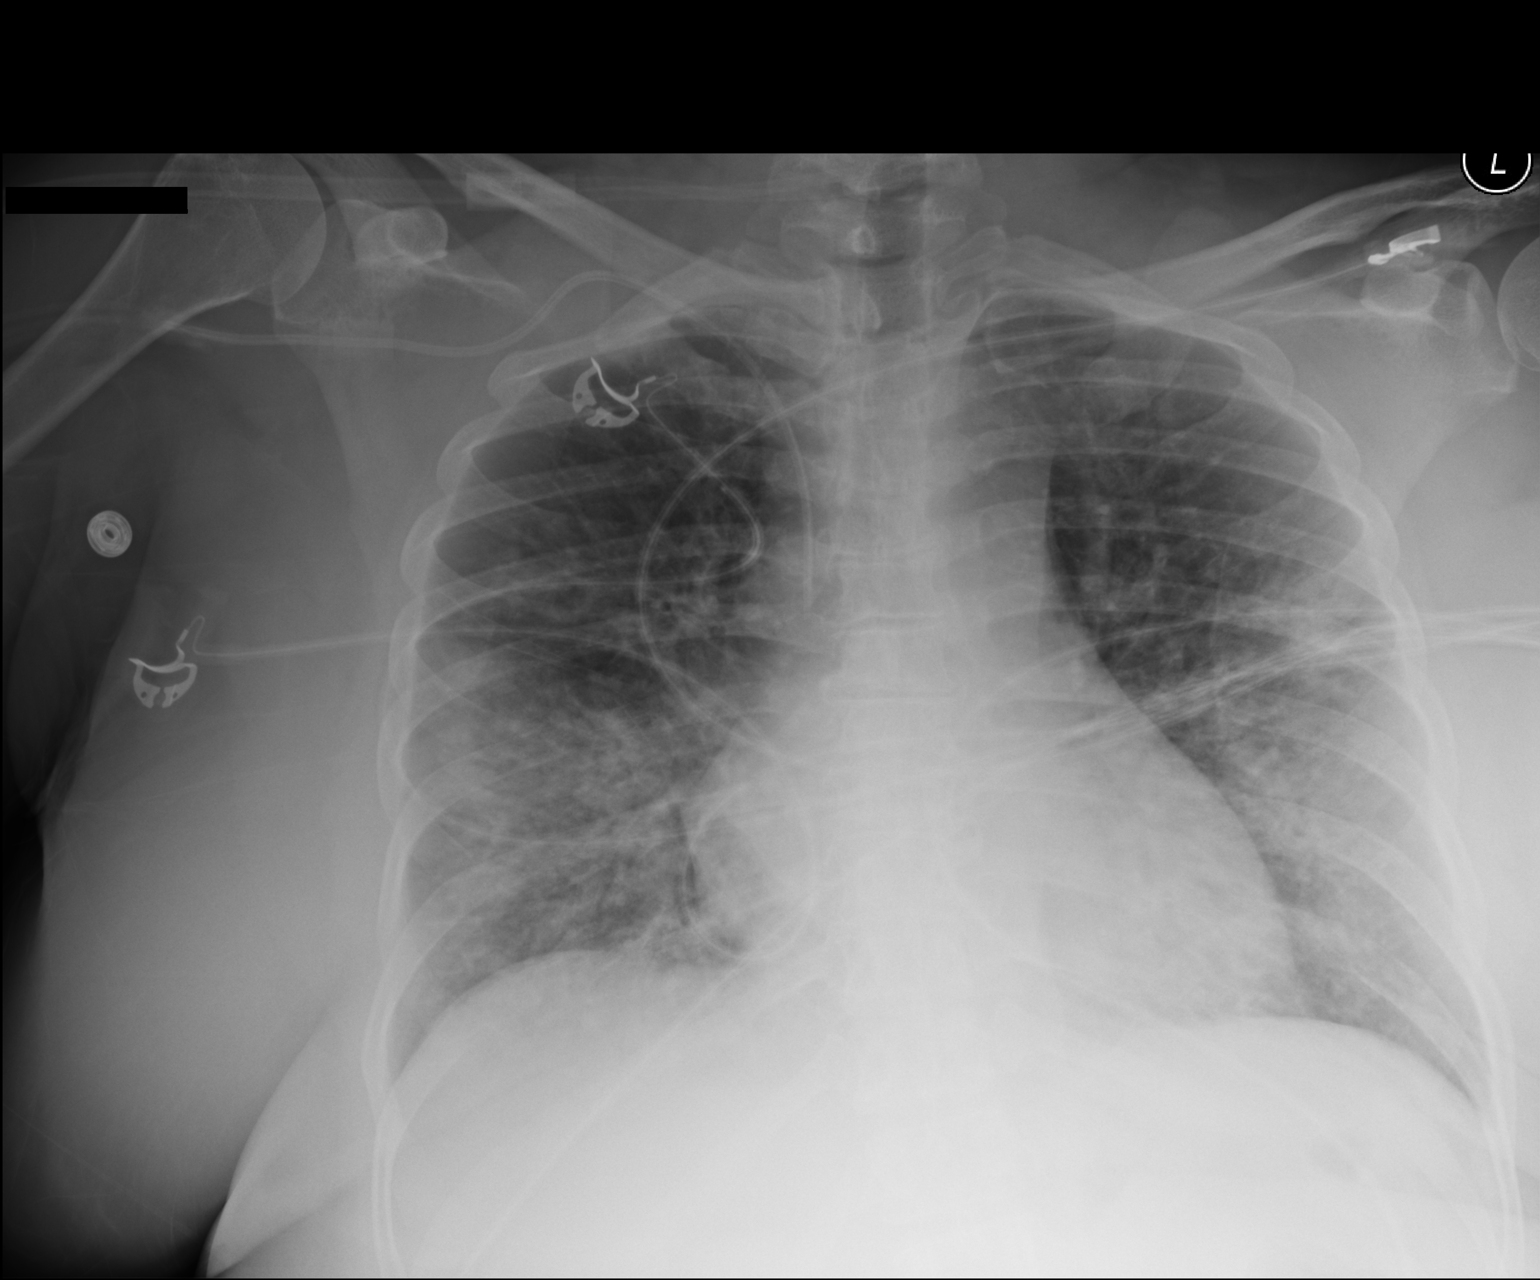

[1 of 1 positions shown; findings below may reference images not displayed]

FINDINGS: Right subclavian central line has been placed with tip about 1.5 cm
above the cavoatrial junction. No pneumothorax.

Mild bilateral patchy perihilar infiltrates again identified. These
appear very similar to the recent prior study.
IMPRESSION: Stable bilateral infiltrates.  Central line as described above.

## 2016-12-30 IMAGING — CR DG ABD PORTABLE 1V
1 series · 1 of 1 positions shown · non-contrast
Comparison: None.

CLINICAL DATA: Orogastric tube placement.  Radiographic evaluation.

EXAM:
PORTABLE ABDOMEN - 1 VIEW

[AP]
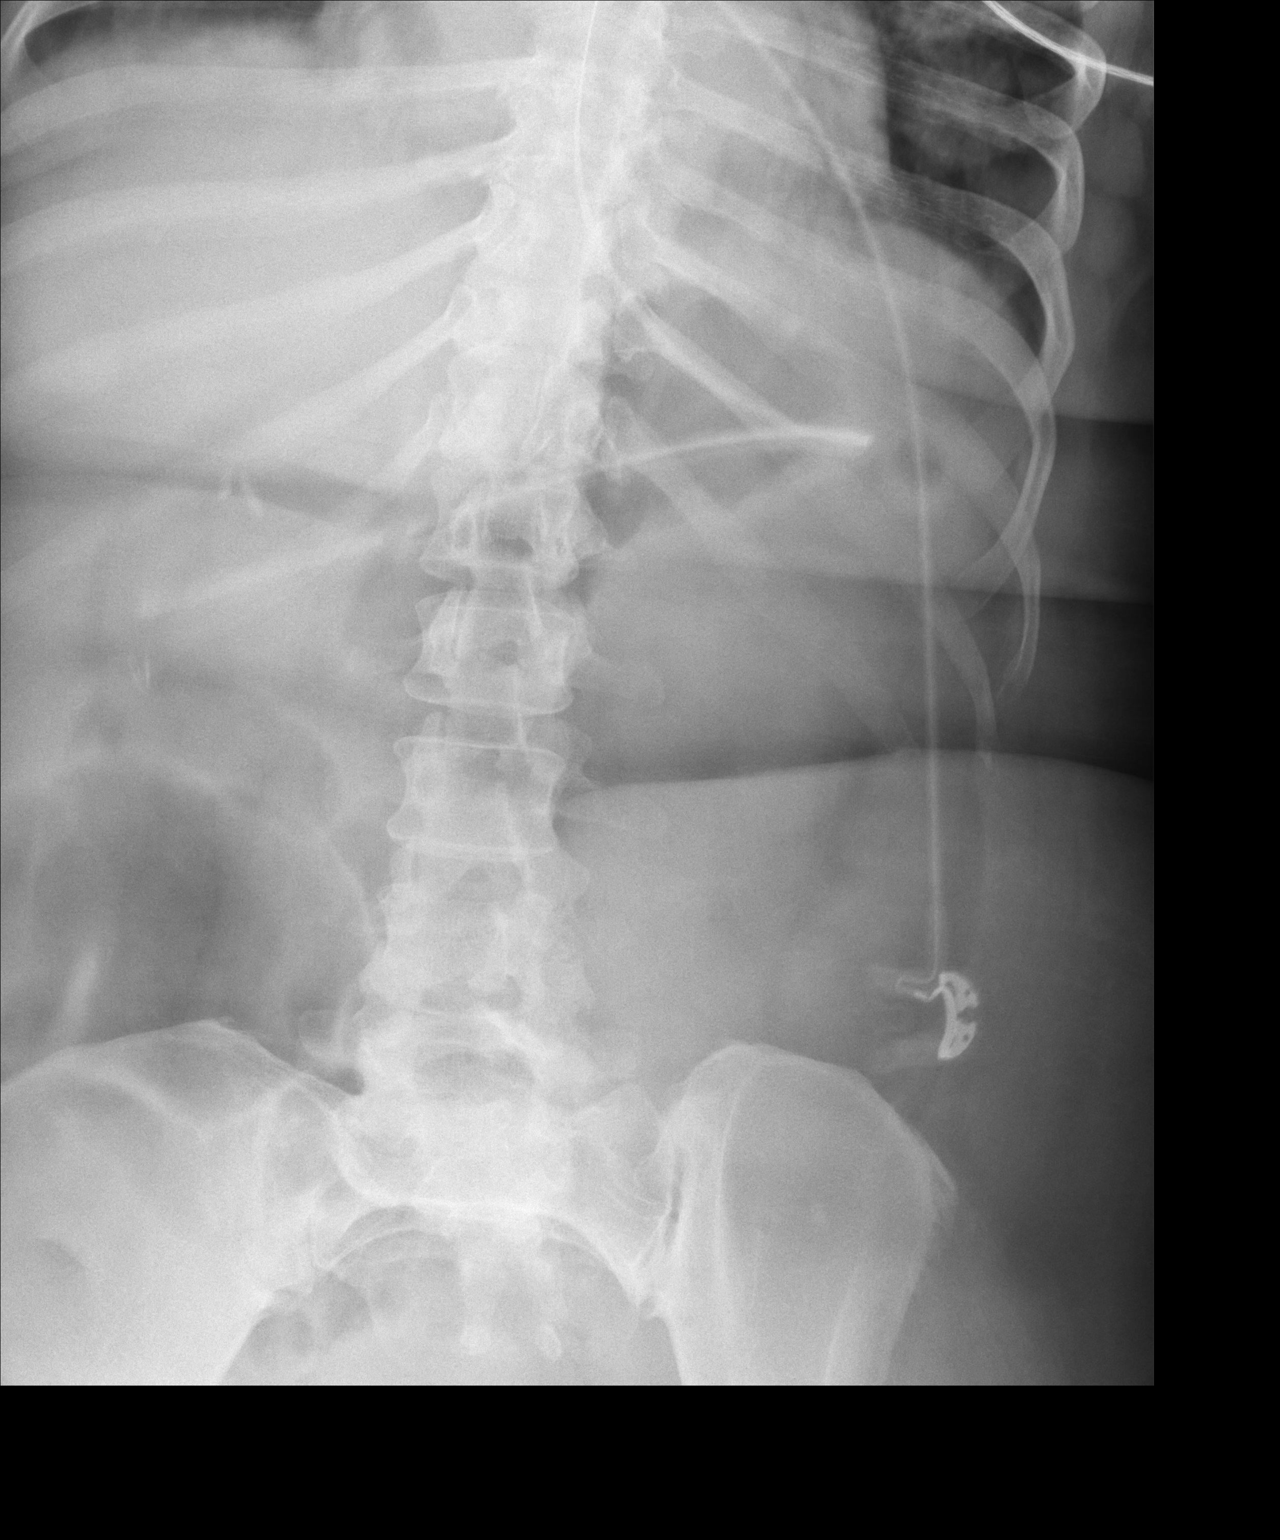

[1 of 1 positions shown; findings below may reference images not displayed]

FINDINGS: Feeding tube is present with the tip either in the antrum of the
stomach or in the duodenal bulb. Restrained motion artifact is
present on the abdominal radiograph, degrading evaluation.
Cholecystectomy clips are present in the right upper quadrant.
IMPRESSION: Enteric tube either in the distal stomach or in the duodenal bulb.

## 2016-12-31 IMAGING — CR DG CHEST 1V PORT
1 series · 1 of 1 positions shown · non-contrast
Comparison: Chest radiograph 1 day prior.

CLINICAL DATA: Pneumonia.

EXAM:
PORTABLE CHEST - 1 VIEW

[AP]
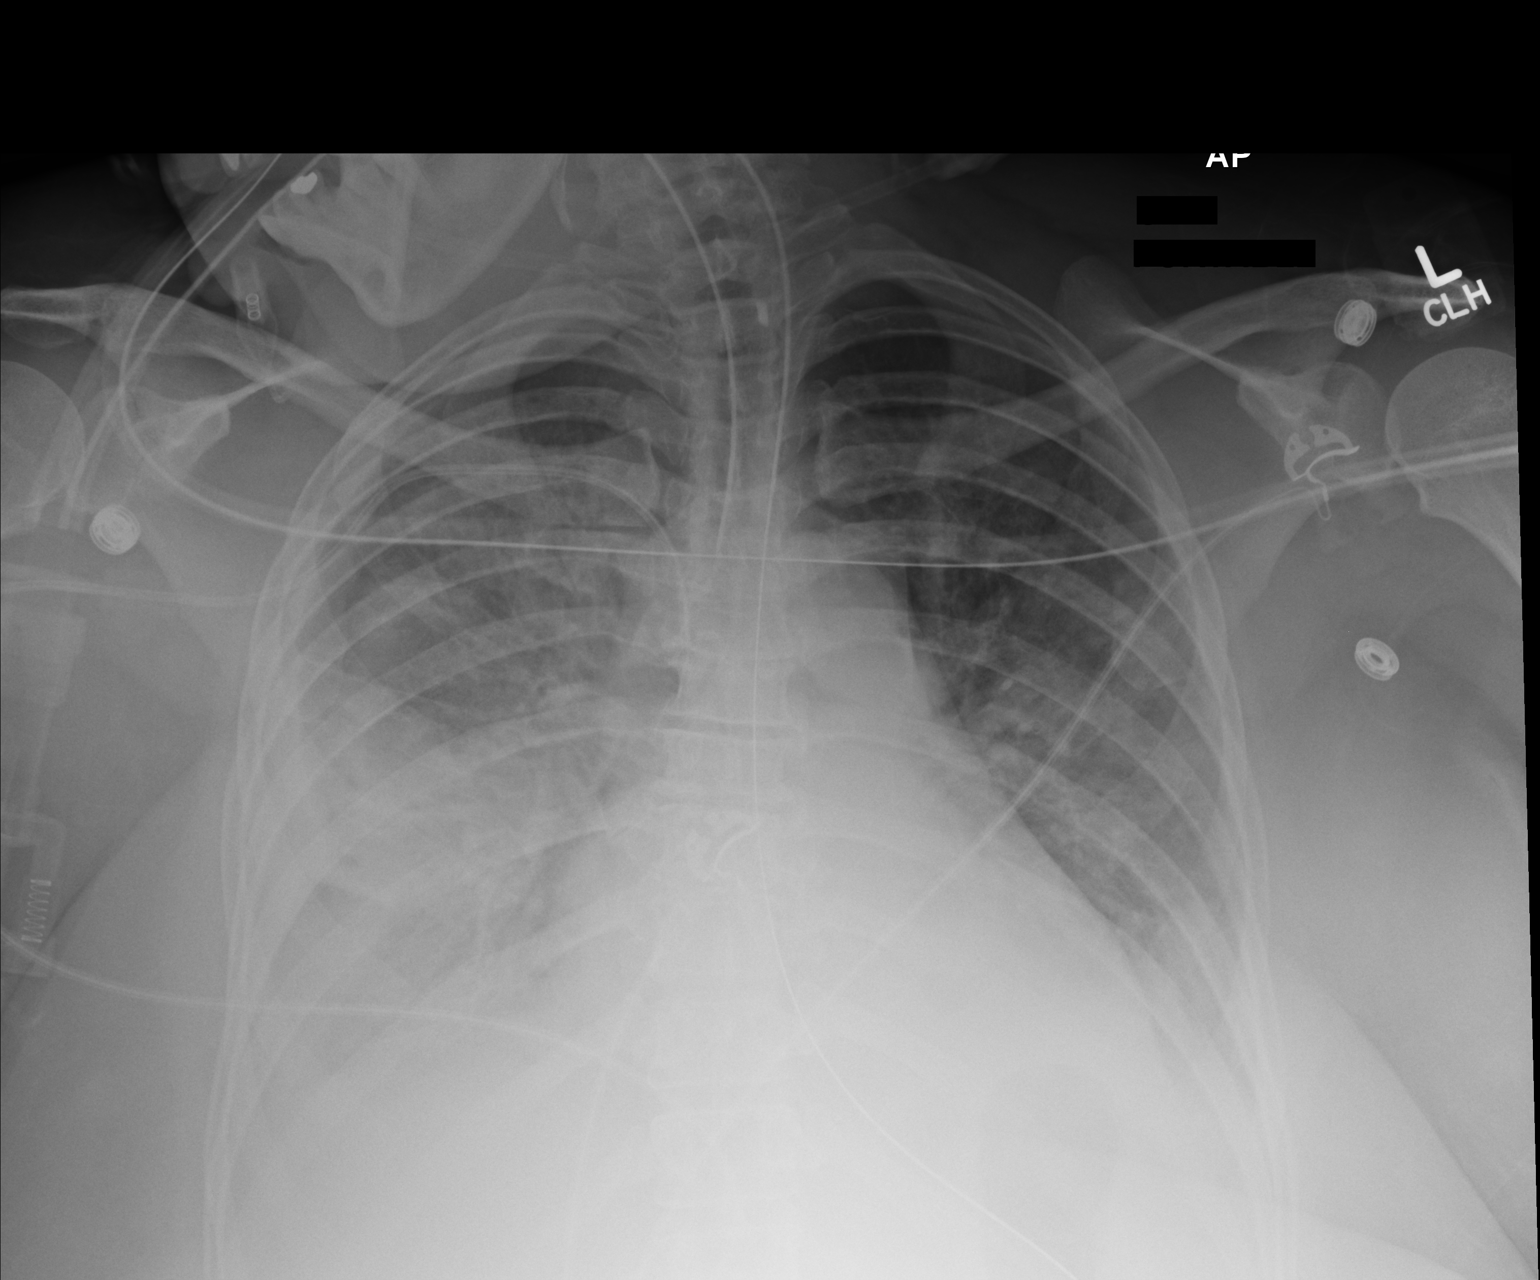

[1 of 1 positions shown; findings below may reference images not displayed]

FINDINGS: Endotracheal tube is 2.4 cm from the carina. Enteric tube is in
place, tip below the diaphragm not included in the field of view.
There is a right central line, tip in the SVC. Worsening right lung
aeration with increasing parenchymal opacity at the right lung base
and developing hazy opacity, may reflect pleural effusion. Unchanged
consolidation in the left mid and lower lung zones. The
cardiomediastinal contours are unchanged. There is no pneumothorax.
IMPRESSION: 1. Worsening right basilar aeration may reflect worsening pneumonia,
developing pleural effusion, or ARDS.
2. Unchanged consolidation in the left lung.
